# Patient Record
Sex: Male | Born: 1985 | Race: White | Hispanic: No | Marital: Married | Smoking: Never smoker
Health system: Southern US, Community
[De-identification: ages and names within clinical notes are randomized; demographics above are authoritative.]

## PROBLEM LIST (undated history)

## (undated) HISTORY — PX: COLONOSCOPY: SHX174

---
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
## 2007-01-31 ENCOUNTER — Emergency Department: Payer: Self-pay | Admitting: Emergency Medicine

## 2014-05-12 ENCOUNTER — Emergency Department: Payer: Self-pay

## 2014-05-12 LAB — COMPREHENSIVE METABOLIC PANEL
ALBUMIN: 3.9 g/dL (ref 3.4–5.0)
ALT: 20 U/L (ref 12–78)
AST: 17 U/L (ref 15–37)
Alkaline Phosphatase: 74 U/L
Anion Gap: 5 — ABNORMAL LOW (ref 7–16)
BUN: 12 mg/dL (ref 7–18)
Bilirubin,Total: 0.3 mg/dL (ref 0.2–1.0)
CALCIUM: 8.9 mg/dL (ref 8.5–10.1)
Chloride: 105 mmol/L (ref 98–107)
Co2: 29 mmol/L (ref 21–32)
Creatinine: 1 mg/dL (ref 0.60–1.30)
EGFR (African American): >60
EGFR (Non-African Amer.): >60
Glucose: 93 mg/dL (ref 65–99)
OSMOLALITY: 277 (ref 275–301)
Potassium: 4.3 mmol/L (ref 3.5–5.1)
Sodium: 139 mmol/L (ref 136–145)
Total Protein: 7.1 g/dL (ref 6.4–8.2)

## 2014-05-12 LAB — CBC WITH DIFFERENTIAL/PLATELET
Basophil #: 0 10*3/uL (ref 0.0–0.1)
Basophil %: 0.6 %
EOS ABS: 0.1 10*3/uL (ref 0.0–0.7)
Eosinophil %: 1.9 %
HCT: 46.7 % (ref 40.0–52.0)
HGB: 15.3 g/dL (ref 13.0–18.0)
LYMPHS PCT: 18.4 %
Lymphocyte #: 0.8 10*3/uL — ABNORMAL LOW (ref 1.0–3.6)
MCH: 31.8 pg (ref 26.0–34.0)
MCHC: 32.8 g/dL (ref 32.0–36.0)
MCV: 97 fL (ref 80–100)
MONOS PCT: 23.7 %
Monocyte #: 1.1 x10 3/mm — ABNORMAL HIGH (ref 0.2–1.0)
NEUTROS ABS: 2.5 10*3/uL (ref 1.4–6.5)
Neutrophil %: 55.4 %
Platelet: 125 10*3/uL — ABNORMAL LOW (ref 150–440)
RBC: 4.81 10*6/uL (ref 4.40–5.90)
RDW: 12.2 % (ref 11.5–14.5)
WBC: 4.6 10*3/uL (ref 3.8–10.6)

## 2014-05-12 LAB — URINALYSIS, COMPLETE
Bacteria: NONE SEEN
Bilirubin,UR: NEGATIVE
Glucose,UR: NEGATIVE mg/dL (ref 0–75)
Ketone: NEGATIVE
LEUKOCYTE ESTERASE: NEGATIVE
NITRITE: NEGATIVE
Ph: 5 (ref 4.5–8.0)
Protein: NEGATIVE
RBC,UR: 1 /HPF (ref 0–5)
Specific Gravity: 1.025 (ref 1.003–1.030)
Squamous Epithelial: NONE SEEN

## 2014-05-12 LAB — LIPASE, BLOOD: LIPASE: 293 U/L (ref 73–393)

## 2014-06-15 ENCOUNTER — Ambulatory Visit: Payer: Self-pay | Admitting: Gastroenterology

## 2015-12-16 ENCOUNTER — Emergency Department
Admission: EM | Admit: 2015-12-16 | Discharge: 2015-12-16 | Disposition: A | Discharge status: Home / Self Care | Payer: BLUE CROSS/BLUE SHIELD | Attending: Emergency Medicine | Admitting: Emergency Medicine

## 2015-12-16 DIAGNOSIS — L03115 Cellulitis of right lower limb: Secondary | ICD-10-CM | POA: Diagnosis not present

## 2015-12-16 DIAGNOSIS — L0390 Cellulitis, unspecified: Secondary | ICD-10-CM

## 2015-12-16 DIAGNOSIS — L02415 Cutaneous abscess of right lower limb: Secondary | ICD-10-CM | POA: Diagnosis present

## 2015-12-16 DIAGNOSIS — L0291 Cutaneous abscess, unspecified: Secondary | ICD-10-CM

## 2015-12-16 MED ORDER — CLINDAMYCIN HCL 300 MG PO CAPS: 300.0000 mg | ORAL_CAPSULE | Freq: Four times a day (QID) | ORAL | Status: DC

## 2015-12-16 MED ORDER — CLINDAMYCIN HCL 150 MG PO CAPS
300.0000 mg | ORAL_CAPSULE | Freq: Once | ORAL | Status: AC
  Administered 2015-12-16: 300 mg via ORAL
  Filled 2015-12-16: qty 2

## 2015-12-16 NOTE — ED Provider Notes (Signed)
[facility] Emergency Department Provider Note    ____________________________________________  Time seen: 2210  I have reviewed the triage vital signs and the nursing notes.   HISTORY  Chief Complaint Abscess   History limited by: Not Limited   HPI Jeremy Perry is a 30 y.o. male who presents to the emergency department today because of concerns for abscess. Patient states that he started developing swelling to his right thigh a number of days ago. It did become painful. He states that it started to drain spontaneously earlier today. He since that that has helped relieve his pain. He states that he has had abscesses in those areas before. He denies any fevers. Denies any nausea or vomiting.     No past medical history on file.  There are no active problems to display for this patient.   No past surgical history on file.  Current Outpatient Rx  Name  Route  Sig  Dispense  Refill  . clindamycin (CLEOCIN) 300 MG capsule   Oral   Take 1 capsule (300 mg total) by mouth 4 (four) times daily.   40 capsule   0     Allergies Ceclor  No family history on file.  Social History Works at [employer]  Constitutional: Negative for fever. Cardiovascular: Negative for chest pain. Respiratory: Negative for shortness of breath. Gastrointestinal: Negative for abdominal pain, vomiting and diarrhea. Skin: Redness, swelling to right thigh Neurological: Negative for headaches, focal weakness or numbness.   10-point ROS otherwise negative.  ____________________________________________   PHYSICAL EXAM:  VITAL SIGNS: ED Triage Vitals  Enc Vitals Group     BP 12/16/15 2138 143/65 mmHg     Pulse Rate 12/16/15 2138 71     Resp 12/16/15 2138 18     Temp 12/16/15 2138 97.7 F (36.5 C)     Temp Source 12/16/15 2138 Oral     SpO2 12/16/15 2138 99 %     Weight 12/16/15 2138 200 lb (90.719 kg)     Height 12/16/15 2138 6\' 1"  (1.854  m)     Head Cir --      Peak Flow --      Pain Score 12/16/15 2146 5   Constitutional: Alert and oriented. Well appearing and in no distress. Eyes: Conjunctivae are normal. PERRL. Normal extraocular movements. ENT   Head: Normocephalic and atraumatic.   Nose: No congestion/rhinnorhea.   Mouth/Throat: Mucous membranes are moist.   Neck: No stridor. Hematological/Lymphatic/Immunilogical: No cervical lymphadenopathy. Cardiovascular: Normal rate, regular rhythm.  No murmurs, rubs, or gallops. Respiratory: Normal respiratory effort without tachypnea nor retractions. Breath sounds are clear and equal bilaterally. No wheezes/rales/rhonchi. Gastrointestinal: Soft and nontender. No distention.  Genitourinary: Deferred Musculoskeletal: Normal range of motion in all extremities. No joint effusions.  No lower extremity tenderness nor edema. Neurologic:  Normal speech and language. No gross focal neurologic deficits are appreciated.  Skin:  Redness and warmth to right inner thigh. Area of small amount of drainage proximally consistent with ruptured abscess. No fluctuance. Bedside US did not show any fluid collection, some cobblestoning. Psychiatric: Mood and affect are normal. Speech and behavior are normal. Patient exhibits appropriate insight and judgment.  ____________________________________________    LABS (pertinent positives/negatives)  None  ____________________________________________   EKG  None  ____________________________________________    RADIOLOGY  None   ____________________________________________   PROCEDURES  Procedure(s) performed: None  Critical Care performed: No  ____________________________________________   INITIAL IMPRESSION / ASSESSMENT AND PLAN /  ED COURSE  Pertinent labs & imaging results that were available during my care of the patient were reviewed by me and considered in my medical decision making (see chart for  details).  Patient presented to the emergency department today with concerns for red Nissen and swelling to right inner thigh. Exam is consistent with a ruptured abscess. I did perform bedside ultrasound which did not show any remaining fluid however there was some cobblestoning. This point I do not feel like I&D would be of any added benefit. Place patient on antibiotics. Give surgery follow-up.  ____________________________________________   FINAL CLINICAL IMPRESSION(S) / ED DIAGNOSES  Final diagnoses:  Cellulitis and abscess     Nance Pear, MD 12/16/15 2321

## 2015-12-16 NOTE — Discharge Instructions (Signed)
Please seek medical attention for any high fevers, chest pain, shortness of breath, change in behavior, persistent vomiting, bloody stool or any other new or concerning symptoms. ° ° °Cellulitis °Cellulitis is an infection of the skin and the tissue beneath it. The infected area is usually red and tender. Cellulitis occurs most often in the arms and lower legs.  °CAUSES  °Cellulitis is caused by bacteria that enter the skin through cracks or cuts in the skin. The most common types of bacteria that cause cellulitis are staphylococci and streptococci. °SIGNS AND SYMPTOMS  °· Redness and warmth. °· Swelling. °· Tenderness or pain. °· Fever. °DIAGNOSIS  °Your health care provider can usually determine what is wrong based on a physical exam. Blood tests may also be done. °TREATMENT  °Treatment usually involves taking an antibiotic medicine. °HOME CARE INSTRUCTIONS  °· Take your antibiotic medicine as directed by your health care provider. Finish the antibiotic even if you start to feel better. °· Keep the infected arm or leg elevated to reduce swelling. °· Apply a warm cloth to the affected area up to 4 times per day to relieve pain. °· Take medicines only as directed by your health care provider. °· Keep all follow-up visits as directed by your health care provider. °SEEK MEDICAL CARE IF:  °· You notice red streaks coming from the infected area. °· Your red area gets larger or turns dark in color. °· Your bone or joint underneath the infected area becomes painful after the skin has healed. °· Your infection returns in the same area or another area. °· You notice a swollen bump in the infected area. °· You develop new symptoms. °· You have a fever. °SEEK IMMEDIATE MEDICAL CARE IF:  °· You feel very sleepy. °· You develop vomiting or diarrhea. °· You have a general ill feeling (malaise) with muscle aches and pains. °  °This information is not intended to replace advice given to you by your health care provider. Make sure  you discuss any questions you have with your health care provider. °  °Document Released: 09/03/2005 Document Revised: 08/15/2015 Document Reviewed: 02/09/2012 °Elsevier Interactive Patient Education ©2016 [employer] ° °

## 2015-12-16 NOTE — ED Notes (Signed)
Pt states that he has a reoccurring cyst to his inner thigh, pt states that he was placed on an antibiotic in nov, pt states that it came back and ruptured some but now the inside of his rt thigh is swollen, pt has an area approx the diameter of a soccer ball to the inner thigh

## 2016-08-14 ENCOUNTER — Ambulatory Visit (INDEPENDENT_AMBULATORY_CARE_PROVIDER_SITE_OTHER): Payer: BLUE CROSS/BLUE SHIELD | Admitting: Gastroenterology

## 2016-08-14 ENCOUNTER — Ambulatory Visit: Payer: Self-pay | Admitting: Gastroenterology

## 2016-08-14 VITALS — BP 136/71 | HR 78 | Temp 98.3°F | Ht 72.0 in | Wt 203.0 lb

## 2016-08-14 DIAGNOSIS — K921 Melena: Secondary | ICD-10-CM | POA: Diagnosis not present

## 2016-08-15 NOTE — Progress Notes (Signed)
   Primary Care Physician: Maryland Pink, MD  Primary Gastroenterologist:  Dr. Lucilla Lame  Chief Complaint  Patient presents with  . Diarrhea  . Rectal Pain    HPI: Jeremy Perry is a 30 y.o. male here Who has a history of rectal bleeding and had a colonoscopy last year.  The patient states that he had an episode of diarrhea with some rectal bleeding which quickly passed.  The patient and states that he had one episode since then of some bright red blood per rectum.  The patient states he has not had any further symptoms of diarrhea or abdominal pain.  The patient has been having normal bowel movements since the episode.  Current Outpatient Prescriptions  Medication Sig Dispense Refill  . clindamycin (CLEOCIN) 300 MG capsule Take 1 capsule (300 mg total) by mouth 4 (four) times daily. 40 capsule 0   No current facility-administered medications for this visit.     Allergies as of 08/14/2016 - Review Complete 08/14/2016  Allergen Reaction Noted  . Ceclor [cefaclor] Other (See Comments) 12/16/2015    ROS:  General: Negative for anorexia, weight loss, fever, chills, fatigue, weakness. ENT: Negative for hoarseness, difficulty swallowing , nasal congestion. CV: Negative for chest pain, angina, palpitations, dyspnea on exertion, peripheral edema.  Respiratory: Negative for dyspnea at rest, dyspnea on exertion, cough, sputum, wheezing.  GI: See history of present illness. GU:  Negative for dysuria, hematuria, urinary incontinence, urinary frequency, nocturnal urination.  Endo: Negative for unusual weight change.    Physical Examination:   BP 136/71   Pulse 78   Temp 98.3 F (36.8 C) (Oral)   Ht 6' (1.829 m)   Wt 203 lb (92.1 kg)   BMI 27.53 kg/m   General: Well-nourished, well-developed in no acute distress.  Eyes: No icterus. Conjunctivae pink. Mouth: Oropharyngeal mucosa moist and pink , no lesions erythema or exudate. Lungs: Clear to auscultation bilaterally.  Non-labored. Heart: Regular rate and rhythm, no murmurs rubs or gallops.  Abdomen: Bowel sounds are normal, nontender, nondistended, no hepatosplenomegaly or masses, no abdominal bruits or hernia , no rebound or guarding.   Extremities: No lower extremity edema. No clubbing or deformities. Neuro: Alert and oriented x 3.  Grossly intact. Skin: Warm and dry, no jaundice.   Psych: Alert and cooperative, normal mood and affect.  Labs:    Imaging Studies: No results found.  Assessment and Plan:   Jeremy Perry is a 30 y.o. y/o male who has a history of similar symptoms in the past with a colonoscopy not showing any cause for his symptoms.  The patient now reports bright red blood per rectum that has resolved. The patient has been told that a repeat colonoscopy is not needed at this time.  His symptoms have completely abated and was likely due to hemorrhoidal bleeding from a recent case of gastroenteritis.  The patient has been told to notify me if his symptoms recur.  The patient has been explained the plan and agrees with it.   Note: This dictation was prepared with Dragon dictation along with smaller phrase technology. Any transcriptional errors that result from this process are unintentional.

## 2018-12-29 ENCOUNTER — Other Ambulatory Visit: Payer: Self-pay | Admitting: Physician Assistant

## 2018-12-29 ENCOUNTER — Other Ambulatory Visit (HOSPITAL_COMMUNITY): Payer: Self-pay | Admitting: Physician Assistant

## 2018-12-29 DIAGNOSIS — Y93B9 Activity, other involving muscle strengthening exercises: Secondary | ICD-10-CM

## 2018-12-29 DIAGNOSIS — R1031 Right lower quadrant pain: Secondary | ICD-10-CM

## 2018-12-31 ENCOUNTER — Ambulatory Visit
Admission: RE | Admit: 2018-12-31 | Discharge: 2018-12-31 | Disposition: A | Discharge status: Home / Self Care | Payer: BLUE CROSS/BLUE SHIELD | Source: Ambulatory Visit | Attending: Physician Assistant | Admitting: Physician Assistant

## 2018-12-31 ENCOUNTER — Other Ambulatory Visit: Payer: Self-pay | Admitting: Physician Assistant

## 2018-12-31 DIAGNOSIS — Y93B9 Activity, other involving muscle strengthening exercises: Secondary | ICD-10-CM | POA: Diagnosis not present

## 2018-12-31 DIAGNOSIS — R1031 Right lower quadrant pain: Secondary | ICD-10-CM | POA: Diagnosis not present

## 2019-12-27 DIAGNOSIS — R1031 Right lower quadrant pain: Secondary | ICD-10-CM | POA: Diagnosis not present

## 2019-12-29 ENCOUNTER — Other Ambulatory Visit: Payer: Self-pay | Admitting: Student

## 2019-12-29 DIAGNOSIS — R1031 Right lower quadrant pain: Secondary | ICD-10-CM

## 2019-12-30 ENCOUNTER — Other Ambulatory Visit: Payer: Self-pay

## 2019-12-30 ENCOUNTER — Ambulatory Visit
Admission: RE | Admit: 2019-12-30 | Discharge: 2019-12-30 | Disposition: A | Discharge status: Home / Self Care | Payer: 59 | Source: Ambulatory Visit | Attending: Student | Admitting: Student

## 2019-12-30 DIAGNOSIS — R1030 Lower abdominal pain, unspecified: Secondary | ICD-10-CM | POA: Diagnosis not present

## 2019-12-30 DIAGNOSIS — R1031 Right lower quadrant pain: Secondary | ICD-10-CM | POA: Diagnosis not present

## 2019-12-30 MED ORDER — IOHEXOL 300 MG/ML  SOLN
100.0000 mL | Freq: Once | INTRAMUSCULAR | Status: AC | PRN
  Administered 2019-12-30: 100 mL via INTRAVENOUS

## 2020-01-09 ENCOUNTER — Ambulatory Visit: Payer: Self-pay | Admitting: General Surgery

## 2020-01-09 DIAGNOSIS — K4090 Unilateral inguinal hernia, without obstruction or gangrene, not specified as recurrent: Secondary | ICD-10-CM | POA: Diagnosis not present

## 2020-01-09 NOTE — H&P (View-Only) (Signed)
PATIENT PROFILE: Jeremy Perry is a 34 y.o. male who presents to the Clinic for consultation at the request of Eulas Post, [place] for evaluation of right groin pain.  PCP:  Lendon Colonel, PA  HISTORY OF PRESENT ILLNESS: Mr. Hemann reports having right groin pain since a year ago.  The patient reported the pain started in the groin. The pains radiates to the right testicle. He has been evaluated by 4 provides for the right groin pain.  One of the provided was a sports medicine doctor.  He evaluated the patient and was 1 who diagnosed with sports hernia.  He was recommended to rest and have anti-inflammatory medications.  The patient follows the recommendations but has not have any improvement of the pain.  The patient incisional male that is physically active that goes to gym.  He denies any pain amount of resting and ibuprofen during the beginning of the pandemic.  The patient reported that he is eating have any improvement of the right groin pain.  He had an ultrasound that did not show any pathology.  I personally evaluated the ultrasound.  Since he persisted with the right groin pain he had a CT scan recently that shows an increased fat layer on the right groin compared to the left groin.  I personally evaluated the CT scan.  There has been no alleviating or aggravating factor.   PROBLEM LIST:     Problem List  Date Reviewed: 12/27/2019   None      GENERAL REVIEW OF SYSTEMS:   General ROS: negative for - chills, fatigue, fever, weight gain or weight loss Allergy and Immunology ROS: negative for - hives  Hematological and Lymphatic ROS: negative for - bleeding problems or bruising, negative for palpable nodes Endocrine ROS: negative for - heat or cold intolerance, hair changes Respiratory ROS: negative for - cough, shortness of breath or wheezing Cardiovascular ROS: no chest pain or palpitations GI ROS: negative for nausea, vomiting, abdominal pain, diarrhea,  constipation Musculoskeletal ROS: negative for - joint swelling or muscle pain Neurological ROS: negative for - confusion, syncope Dermatological ROS: negative for pruritus and rash Psychiatric: negative for anxiety, depression, difficulty sleeping and memory loss  MEDICATIONS: Current Medications        Current Outpatient Medications  Medication Sig Dispense Refill  . ibuprofen (MOTRIN) 200 MG tablet Take 200 mg by mouth every 6 (six) hours as needed for Pain     No current facility-administered medications for this visit.       ALLERGIES: Ceclor [cefaclor]  PAST MEDICAL HISTORY: History reviewed. No pertinent past medical history.  PAST SURGICAL HISTORY: History reviewed. No pertinent surgical history.   FAMILY HISTORY:      Family History  Problem Relation Age of Onset  . Scoliosis Mother   . No Known Problems Father   . Stroke Paternal Grandmother   . Diabetes type II Paternal Grandmother   . Bipolar disorder Paternal Grandmother   . Myocardial Infarction (Heart attack) Paternal Grandmother   . No Known Problems Sister   . Cancer Maternal Grandmother   . Brain cancer Maternal Grandfather   . COPD Paternal Grandfather      SOCIAL HISTORY: Social History          Socioeconomic History  . Marital status: Married    Spouse name: Not on file  . Number of children: Not on file  . Years of education: Not on file  . Highest education level: Not on file  Occupational History  .  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 Financial resource strain: Not on file  . Food insecurity    Worry: Not on file    Inability: Not on file  . Transportation needs    Medical: Not on file    Non-medical: Not on file  Tobacco Use  . Smoking status: Never Smoker  . Smokeless tobacco: Never Used  Substance and Sexual Activity  . Alcohol use: Yes    Alcohol/week: 0.0 standard drinks    Frequency: 2-4 times a  month  . Drug use: Never  . Sexual activity: Yes    Partners: Female    Birth control/protection: None  Other Topics Concern  . Not on file  Social History Narrative  . Not on file      PHYSICAL EXAM:    Vitals:   01/09/20 0845  BP: 134/80  Pulse: 90   Body mass index is 27.48 kg/m. Weight: 89.4 kg (197 lb)   GENERAL: Alert, active, oriented x3  HEENT: Pupils equal reactive to light. Extraocular movements are intact. Sclera clear. Palpebral conjunctiva normal red color.Pharynx clear.  NECK: Supple with no palpable mass and no adenopathy.  LUNGS: Sound clear with no rales rhonchi or wheezes.  HEART: Regular rhythm S1 and S2 without murmur.  ABDOMEN: Soft and depressible, nontender with no palpable mass, no hepatomegaly.  No palpable hernia defect.  There was tenderness to palpation on the medial aspect of the right groin.  Examination was done using Valsalva.  EXTREMITIES: Well-developed well-nourished symmetrical with no dependent edema.  NEUROLOGICAL: Awake alert oriented, facial expression symmetrical, moving all extremities.  REVIEW OF DATA: I have reviewed the following data today:      Office Visit on 12/27/2019  Component Date Value  . WBC (White Blood Cell Co* 12/27/2019 4.3   . RBC (Red Blood Cell Coun* 12/27/2019 4.63*  . Hemoglobin 12/27/2019 14.9   . Hematocrit 12/27/2019 43.5   . MCV (Mean Corpuscular Vo* 12/27/2019 94.0   . MCH (Mean Corpuscular He* 12/27/2019 32.2*  . MCHC (Mean Corpuscular H* 12/27/2019 34.3   . Platelet Count 12/27/2019 159   . RDW-CV (Red Cell Distrib* 12/27/2019 11.2*  . MPV (Mean Platelet Volum* 12/27/2019 9.4   . Neutrophils 12/27/2019 2.50   . Lymphocytes 12/27/2019 1.20   . Mixed Count 12/27/2019 0.60   . Neutrophil % 12/27/2019 58.3   . Lymphocyte % 12/27/2019 28.6   . Mixed % 12/27/2019 13.1   . Glucose 12/27/2019 84   . Sodium 12/27/2019 140   . Potassium 12/27/2019 4.3   . Chloride 12/27/2019 106    . Carbon Dioxide (CO2) 12/27/2019 30.5   . Urea Nitrogen (BUN) 12/27/2019 15   . Creatinine 12/27/2019 1.0   . Glomerular Filtration Ra* 12/27/2019 87   . Calcium 12/27/2019 9.9   . AST  12/27/2019 20   . ALT  12/27/2019 28   . Alk Phos (alkaline Phosp* 12/27/2019 55   . Albumin 12/27/2019 4.7   . Bilirubin, Total 12/27/2019 0.7   . Protein, Total 12/27/2019 7.0   . A/G Ratio 12/27/2019 2.0      ASSESSMENT: Mr. Clouse is a 34 y.o. male presenting for consultation for right groin pain.  The patient presents with significant right groin pain with a year of onset.  The patient has tried resting and anti-inflammatory medication.  He has been doing some exercise to try to strengthen the lower extremity and core.  There has  been no change on the right groin pain since last year.  The pain is intermittent.  No alleviating or grading factor.  Due to the patient history of right groin pain of a year that has tried rest and anti-inflammatory medication and exercises without any improvement I agree with the sports medicine physician that sports hernia is high in the differential diagnosis.  I discussed with the patient about the diagnosis of sports hernia.  I oriented the patient about what the sports hernia mean basically a weakness of the pelvic floor and inguinal canal.  The patient also have a suspected lipoma on the CT scan that might be affecting and causing pain on the right groin as well. The patient was oriented about the treatment alternatives (observation vs surgical repair). Due to patient symptoms for a year or longer, repair with mesh is recommended. Patient oriented about the surgical procedure, the use of mesh and its risk of complications such as: infection, bleeding, injury to vas deference, vasculature and testicle, injury to bowel or bladder, and chronic pain.  Sports hernia, initial encounter [S39.81XA]  PLAN: 1. Robotic assisted laparoscopic right inguinal hernia  repair with mesh (71836) 2. CBC, CMP done 12/27/19 3. Avoid taking aspirin 5 days before the surgery 4. Contact us if has any question or concern.   Patient verbalized understanding, all questions were answered, and were agreeable with the plan outlined above.    Herbert Pun, MD  Electronically signed by Herbert Pun, MD

## 2020-01-09 NOTE — H&P (Signed)
PATIENT PROFILE: Jeremy Perry is a 34 y.o. male who presents to the Clinic for consultation at the request of Jeremy Perry, [place] for evaluation of right groin pain.  PCP:  Jeremy Colonel, PA  HISTORY OF PRESENT ILLNESS: Jeremy Perry reports having right groin pain since a year ago.  The patient reported the pain started in the groin. The pains radiates to the right testicle. He has been evaluated by 4 provides for the right groin pain.  One of the provided was a sports medicine doctor.  He evaluated the patient and was 1 who diagnosed with sports hernia.  He was recommended to rest and have anti-inflammatory medications.  The patient follows the recommendations but has not have any improvement of the pain.  The patient incisional male that is physically active that goes to gym.  He denies any pain amount of resting and ibuprofen during the beginning of the pandemic.  The patient reported that he is eating have any improvement of the right groin pain.  He had an ultrasound that did not show any pathology.  I personally evaluated the ultrasound.  Since he persisted with the right groin pain he had a CT scan recently that shows an increased fat layer on the right groin compared to the left groin.  I personally evaluated the CT scan.  There has been no alleviating or aggravating factor.   PROBLEM LIST:     Problem List  Date Reviewed: 12/27/2019   None      GENERAL REVIEW OF SYSTEMS:   General ROS: negative for - chills, fatigue, fever, weight gain or weight loss Allergy and Immunology ROS: negative for - hives  Hematological and Lymphatic ROS: negative for - bleeding problems or bruising, negative for palpable nodes Endocrine ROS: negative for - heat or cold intolerance, hair changes Respiratory ROS: negative for - cough, shortness of breath or wheezing Cardiovascular ROS: no chest pain or palpitations GI ROS: negative for nausea, vomiting, abdominal pain, diarrhea,  constipation Musculoskeletal ROS: negative for - joint swelling or muscle pain Neurological ROS: negative for - confusion, syncope Dermatological ROS: negative for pruritus and rash Psychiatric: negative for anxiety, depression, difficulty sleeping and memory loss  MEDICATIONS: Current Medications        Current Outpatient Medications  Medication Sig Dispense Refill  . ibuprofen (MOTRIN) 200 MG tablet Take 200 mg by mouth every 6 (six) hours as needed for Pain     No current facility-administered medications for this visit.       ALLERGIES: Ceclor [cefaclor]  PAST MEDICAL HISTORY: History reviewed. No pertinent past medical history.  PAST SURGICAL HISTORY: History reviewed. No pertinent surgical history.   FAMILY HISTORY:      Family History  Problem Relation Age of Onset  . Scoliosis Mother   . No Known Problems Father   . Stroke Paternal Grandmother   . Diabetes type II Paternal Grandmother   . Bipolar disorder Paternal Grandmother   . Myocardial Infarction (Heart attack) Paternal Grandmother   . No Known Problems Sister   . Cancer Maternal Grandmother   . Brain cancer Maternal Grandfather   . COPD Paternal Grandfather      SOCIAL HISTORY: Social History          Socioeconomic History  . Marital status: Married    Spouse name: Not on file  . Number of children: Not on file  . Years of education: Not on file  . Highest education level: Not on file  Occupational History  .  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 Financial resource strain: Not on file  . Food insecurity    Worry: Not on file    Inability: Not on file  . Transportation needs    Medical: Not on file    Non-medical: Not on file  Tobacco Use  . Smoking status: Never Smoker  . Smokeless tobacco: Never Used  Substance and Sexual Activity  . Alcohol use: Yes    Alcohol/week: 0.0 standard drinks    Frequency: 2-4 times a  month  . Drug use: Never  . Sexual activity: Yes    Partners: Female    Birth control/protection: None  Other Topics Concern  . Not on file  Social History Narrative  . Not on file      PHYSICAL EXAM:    Vitals:   01/09/20 0845  BP: 134/80  Pulse: 90   Body mass index is 27.48 kg/m. Weight: 89.4 kg (197 lb)   GENERAL: Alert, active, oriented x3  HEENT: Pupils equal reactive to light. Extraocular movements are intact. Sclera clear. Palpebral conjunctiva normal red color.Pharynx clear.  NECK: Supple with no palpable mass and no adenopathy.  LUNGS: Sound clear with no rales rhonchi or wheezes.  HEART: Regular rhythm S1 and S2 without murmur.  ABDOMEN: Soft and depressible, nontender with no palpable mass, no hepatomegaly.  No palpable hernia defect.  There was tenderness to palpation on the medial aspect of the right groin.  Examination was done using Valsalva.  EXTREMITIES: Well-developed well-nourished symmetrical with no dependent edema.  NEUROLOGICAL: Awake alert oriented, facial expression symmetrical, moving all extremities.  REVIEW OF DATA: I have reviewed the following data today:      Office Visit on 12/27/2019  Component Date Value  . WBC (White Blood Cell Co* 12/27/2019 4.3   . RBC (Red Blood Cell Coun* 12/27/2019 4.63*  . Hemoglobin 12/27/2019 14.9   . Hematocrit 12/27/2019 43.5   . MCV (Mean Corpuscular Vo* 12/27/2019 94.0   . MCH (Mean Corpuscular He* 12/27/2019 32.2*  . MCHC (Mean Corpuscular H* 12/27/2019 34.3   . Platelet Count 12/27/2019 159   . RDW-CV (Red Cell Distrib* 12/27/2019 11.2*  . MPV (Mean Platelet Volum* 12/27/2019 9.4   . Neutrophils 12/27/2019 2.50   . Lymphocytes 12/27/2019 1.20   . Mixed Count 12/27/2019 0.60   . Neutrophil % 12/27/2019 58.3   . Lymphocyte % 12/27/2019 28.6   . Mixed % 12/27/2019 13.1   . Glucose 12/27/2019 84   . Sodium 12/27/2019 140   . Potassium 12/27/2019 4.3   . Chloride 12/27/2019 106    . Carbon Dioxide (CO2) 12/27/2019 30.5   . Urea Nitrogen (BUN) 12/27/2019 15   . Creatinine 12/27/2019 1.0   . Glomerular Filtration Ra* 12/27/2019 87   . Calcium 12/27/2019 9.9   . AST  12/27/2019 20   . ALT  12/27/2019 28   . Alk Phos (alkaline Phosp* 12/27/2019 55   . Albumin 12/27/2019 4.7   . Bilirubin, Total 12/27/2019 0.7   . Protein, Total 12/27/2019 7.0   . A/G Ratio 12/27/2019 2.0      ASSESSMENT: Mr. Chestnut is a 34 y.o. male presenting for consultation for right groin pain.  The patient presents with significant right groin pain with a year of onset.  The patient has tried resting and anti-inflammatory medication.  He has been doing some exercise to try to strengthen the lower extremity and core.  There has  been no change on the right groin pain since last year.  The pain is intermittent.  No alleviating or grading factor.  Due to the patient history of right groin pain of a year that has tried rest and anti-inflammatory medication and exercises without any improvement I agree with the sports medicine physician that sports hernia is high in the differential diagnosis.  I discussed with the patient about the diagnosis of sports hernia.  I oriented the patient about what the sports hernia mean basically a weakness of the pelvic floor and inguinal canal.  The patient also have a suspected lipoma on the CT scan that might be affecting and causing pain on the right groin as well. The patient was oriented about the treatment alternatives (observation vs surgical repair). Due to patient symptoms for a year or longer, repair with mesh is recommended. Patient oriented about the surgical procedure, the use of mesh and its risk of complications such as: infection, bleeding, injury to vas deference, vasculature and testicle, injury to bowel or bladder, and chronic pain.  Sports hernia, initial encounter [S39.81XA]  PLAN: 1. Robotic assisted laparoscopic right inguinal hernia  repair with mesh ([id number]) 2. CBC, CMP done 12/27/19 3. Avoid taking aspirin 5 days before the surgery 4. Contact us if has any question or concern.   Patient verbalized understanding, all questions were answered, and were agreeable with the plan outlined above.    Herbert Pun, MD  Electronically signed by Herbert Pun, MD

## 2020-01-17 ENCOUNTER — Other Ambulatory Visit: Payer: Self-pay

## 2020-01-17 ENCOUNTER — Encounter
Admission: RE | Admit: 2020-01-17 | Discharge: 2020-01-17 | Disposition: A | Discharge status: Home / Self Care | Payer: Managed Care, Other (non HMO) | Source: Ambulatory Visit | Attending: General Surgery | Admitting: General Surgery

## 2020-01-17 NOTE — Patient Instructions (Signed)
Your procedure is scheduled on: 01/25/20 Report to [facility]. To find out your arrival time please call [phone] between 1PM - 3PM on 01/24/20.  Remember: Instructions that are not followed completely may result in serious medical risk, up to and including death, or upon the discretion of your surgeon and anesthesiologist your surgery may need to be rescheduled.     _X__ 1. Do not eat food after midnight the night before your procedure.                 No gum chewing or hard candies. You may drink clear liquids up to 2 hours                 before you are scheduled to arrive for your surgery- DO not drink clear                 liquids within 2 hours of the start of your surgery.                 Clear Liquids include:  water, apple juice without pulp, clear carbohydrate                 drink such as Clearfast or Gatorade, Black Coffee or Tea (Do not add                 anything to coffee or tea). Diabetics water only  __X__2.  On the morning of surgery brush your teeth with toothpaste and water, you                 may rinse your mouth with mouthwash if you wish.  Do not swallow any              toothpaste of mouthwash.     _X__ 3.  No Alcohol for 24 hours before or after surgery.   _X__ 4.  Do Not Smoke or use e-cigarettes For 24 Hours Prior to Your Surgery.                 Do not use any chewable tobacco products for at least 6 hours prior to                 surgery.  ____  5.  Bring all medications with you on the day of surgery if instructed.   __X__  6.  Notify your doctor if there is any change in your medical condition      (cold, fever, infections).     Do not wear jewelry, make-up, hairpins, clips or nail polish. Do not wear lotions, powders, or perfumes.  Do not shave 48 hours prior to surgery. Men may shave face and neck. Do not bring valuables to the hospital.    [facility] is not responsible for any belongings or  valuables.  Contacts, dentures/partials or body piercings may not be worn into surgery. Bring a case for your contacts, glasses or hearing aids, a denture cup will be supplied. Leave your suitcase in the car. After surgery it may be brought to your room. For patients admitted to the hospital, discharge time is determined by your treatment team.   Patients discharged the day of surgery will not be allowed to drive home.   Please read over the following fact sheets that you were given:   MRSA Information  __X__ Take these medicines the morning of surgery with A SIP OF WATER:  1. none  2.   3.   4.  5.  6.  ____ Fleet Enema (as directed)   __X__ Use CHG Soap/SAGE wipes as directed  ____ Use inhalers on the day of surgery  ____ Stop metformin/Janumet/Farxiga 2 days prior to surgery    ____ Take 1/2 of usual insulin dose the night before surgery. No insulin the morning          of surgery.   ____ Stop Blood Thinners Coumadin/Plavix/Xarelto/Pleta/Pradaxa/Eliquis/Effient/Aspirin  on   Or contact your Surgeon, Cardiologist or Medical Doctor regarding  ability to stop your blood thinners  __X__ Stop Anti-inflammatories 7 days before surgery such as Advil, Ibuprofen, Motrin,  BC or Goodies Powder, Naprosyn, Naproxen, Aleve, Aspirin    __X__ Stop all herbal supplements, fish oil or vitamin E until after surgery.    ____ Bring C-Pap to the hospital.

## 2020-01-23 ENCOUNTER — Other Ambulatory Visit
Admission: RE | Admit: 2020-01-23 | Discharge: 2020-01-23 | Disposition: A | Discharge status: Home / Self Care | Payer: Managed Care, Other (non HMO) | Source: Ambulatory Visit | Attending: General Surgery | Admitting: General Surgery

## 2020-01-23 ENCOUNTER — Other Ambulatory Visit: Payer: Self-pay

## 2020-01-23 DIAGNOSIS — Z20822 Contact with and (suspected) exposure to covid-19: Secondary | ICD-10-CM | POA: Insufficient documentation

## 2020-01-23 DIAGNOSIS — Z01812 Encounter for preprocedural laboratory examination: Secondary | ICD-10-CM | POA: Insufficient documentation

## 2020-01-24 LAB — SARS CORONAVIRUS 2 (TAT 6-24 HRS): SARS Coronavirus 2: NEGATIVE

## 2020-01-24 MED ORDER — CLINDAMYCIN PHOSPHATE 900 MG/50ML IV SOLN
900.0000 mg | INTRAVENOUS | Status: AC
  Administered 2020-01-25: 13:00:00 900 mg via INTRAVENOUS

## 2020-01-25 ENCOUNTER — Other Ambulatory Visit: Payer: Self-pay

## 2020-01-25 ENCOUNTER — Ambulatory Visit: Payer: 59 | Admitting: Certified Registered Nurse Anesthetist

## 2020-01-25 ENCOUNTER — Ambulatory Visit
Admission: RE | Admit: 2020-01-25 | Discharge: 2020-01-25 | Disposition: A | Discharge status: Home / Self Care | Payer: 59 | Attending: General Surgery | Admitting: General Surgery

## 2020-01-25 ENCOUNTER — Encounter: Payer: Self-pay | Admitting: General Surgery

## 2020-01-25 ENCOUNTER — Encounter
Admission: RE | Disposition: A | Discharge status: Home / Self Care | Payer: Self-pay | Source: Home / Self Care | Attending: General Surgery

## 2020-01-25 DIAGNOSIS — Z823 Family history of stroke: Secondary | ICD-10-CM | POA: Diagnosis not present

## 2020-01-25 DIAGNOSIS — K4090 Unilateral inguinal hernia, without obstruction or gangrene, not specified as recurrent: Secondary | ICD-10-CM | POA: Diagnosis not present

## 2020-01-25 DIAGNOSIS — Z8249 Family history of ischemic heart disease and other diseases of the circulatory system: Secondary | ICD-10-CM | POA: Insufficient documentation

## 2020-01-25 DIAGNOSIS — Z791 Long term (current) use of non-steroidal anti-inflammatories (NSAID): Secondary | ICD-10-CM | POA: Diagnosis not present

## 2020-01-25 DIAGNOSIS — Z888 Allergy status to other drugs, medicaments and biological substances status: Secondary | ICD-10-CM | POA: Insufficient documentation

## 2020-01-25 DIAGNOSIS — Z818 Family history of other mental and behavioral disorders: Secondary | ICD-10-CM | POA: Insufficient documentation

## 2020-01-25 DIAGNOSIS — Z825 Family history of asthma and other chronic lower respiratory diseases: Secondary | ICD-10-CM | POA: Diagnosis not present

## 2020-01-25 DIAGNOSIS — Z8269 Family history of other diseases of the musculoskeletal system and connective tissue: Secondary | ICD-10-CM | POA: Diagnosis not present

## 2020-01-25 DIAGNOSIS — R69 Illness, unspecified: Secondary | ICD-10-CM | POA: Diagnosis not present

## 2020-01-25 DIAGNOSIS — D176 Benign lipomatous neoplasm of spermatic cord: Secondary | ICD-10-CM | POA: Diagnosis not present

## 2020-01-25 DIAGNOSIS — Z808 Family history of malignant neoplasm of other organs or systems: Secondary | ICD-10-CM | POA: Insufficient documentation

## 2020-01-25 DIAGNOSIS — Z833 Family history of diabetes mellitus: Secondary | ICD-10-CM | POA: Diagnosis not present

## 2020-01-25 DIAGNOSIS — Z881 Allergy status to other antibiotic agents status: Secondary | ICD-10-CM | POA: Insufficient documentation

## 2020-01-25 DIAGNOSIS — Z809 Family history of malignant neoplasm, unspecified: Secondary | ICD-10-CM | POA: Diagnosis not present

## 2020-01-25 HISTORY — PX: XI ROBOTIC ASSISTED INGUINAL HERNIA REPAIR WITH MESH: SHX6706

## 2020-01-25 SURGERY — REPAIR, HERNIA, INGUINAL, ROBOT-ASSISTED, LAPAROSCOPIC, USING MESH
Anesthesia: General | Site: Groin | Laterality: Right

## 2020-01-25 MED ORDER — HYDROCODONE-ACETAMINOPHEN 5-325 MG PO TABS: 1.0000 | ORAL_TABLET | ORAL | 0 refills | Status: AC | PRN

## 2020-01-25 MED ORDER — BUPIVACAINE-EPINEPHRINE 0.25% -1:200000 IJ SOLN
INTRAMUSCULAR | Status: DC | PRN
  Administered 2020-01-25: 10 mL

## 2020-01-25 MED ORDER — MIDAZOLAM HCL 2 MG/2ML IJ SOLN
INTRAMUSCULAR | Status: AC
  Filled 2020-01-25: qty 2

## 2020-01-25 MED ORDER — DEXAMETHASONE SODIUM PHOSPHATE 10 MG/ML IJ SOLN
INTRAMUSCULAR | Status: DC | PRN
  Administered 2020-01-25: 10 mg via INTRAVENOUS

## 2020-01-25 MED ORDER — SUGAMMADEX SODIUM 200 MG/2ML IV SOLN
INTRAVENOUS | Status: DC | PRN
  Administered 2020-01-25: 200 mg via INTRAVENOUS

## 2020-01-25 MED ORDER — EPINEPHRINE PF 1 MG/ML IJ SOLN
INTRAMUSCULAR | Status: AC
  Filled 2020-01-25: qty 1

## 2020-01-25 MED ORDER — ROCURONIUM BROMIDE 50 MG/5ML IV SOLN
INTRAVENOUS | Status: AC
  Filled 2020-01-25: qty 1

## 2020-01-25 MED ORDER — LIDOCAINE HCL (PF) 2 % IJ SOLN
INTRAMUSCULAR | Status: AC
  Filled 2020-01-25: qty 10

## 2020-01-25 MED ORDER — PROMETHAZINE HCL 25 MG/ML IJ SOLN: 6.2500 mg | INTRAMUSCULAR | Status: DC | PRN

## 2020-01-25 MED ORDER — ROCURONIUM BROMIDE 100 MG/10ML IV SOLN
INTRAVENOUS | Status: DC | PRN
  Administered 2020-01-25: 10 mg via INTRAVENOUS
  Administered 2020-01-25: 50 mg via INTRAVENOUS
  Administered 2020-01-25: 20 mg via INTRAVENOUS

## 2020-01-25 MED ORDER — ONDANSETRON HCL 4 MG/2ML IJ SOLN
INTRAMUSCULAR | Status: DC | PRN
  Administered 2020-01-25: 4 mg via INTRAVENOUS

## 2020-01-25 MED ORDER — CEFAZOLIN SODIUM-DEXTROSE 2-4 GM/100ML-% IV SOLN
INTRAVENOUS | Status: AC
  Filled 2020-01-25: qty 100

## 2020-01-25 MED ORDER — DEXAMETHASONE SODIUM PHOSPHATE 10 MG/ML IJ SOLN
INTRAMUSCULAR | Status: AC
  Filled 2020-01-25: qty 1

## 2020-01-25 MED ORDER — HYDROCODONE-ACETAMINOPHEN 5-325 MG PO TABS
ORAL_TABLET | ORAL | Status: AC
  Filled 2020-01-25: qty 1

## 2020-01-25 MED ORDER — CLINDAMYCIN PHOSPHATE 900 MG/50ML IV SOLN
INTRAVENOUS | Status: AC
  Filled 2020-01-25: qty 50

## 2020-01-25 MED ORDER — HYDROCODONE-ACETAMINOPHEN 5-325 MG PO TABS
1.0000 | ORAL_TABLET | ORAL | Status: DC | PRN
  Administered 2020-01-25: 1 via ORAL

## 2020-01-25 MED ORDER — FENTANYL CITRATE (PF) 100 MCG/2ML IJ SOLN
INTRAMUSCULAR | Status: DC | PRN
  Administered 2020-01-25: 100 ug via INTRAVENOUS

## 2020-01-25 MED ORDER — ACETAMINOPHEN 10 MG/ML IV SOLN
INTRAVENOUS | Status: DC | PRN
  Administered 2020-01-25: 1000 mg via INTRAVENOUS

## 2020-01-25 MED ORDER — ACETAMINOPHEN 10 MG/ML IV SOLN
INTRAVENOUS | Status: AC
  Filled 2020-01-25: qty 100

## 2020-01-25 MED ORDER — FENTANYL CITRATE (PF) 100 MCG/2ML IJ SOLN
25.0000 ug | INTRAMUSCULAR | Status: DC | PRN
  Administered 2020-01-25 (×3): 25 ug via INTRAVENOUS

## 2020-01-25 MED ORDER — FAMOTIDINE 20 MG PO TABS
ORAL_TABLET | ORAL | Status: AC
  Filled 2020-01-25: qty 1

## 2020-01-25 MED ORDER — FENTANYL CITRATE (PF) 100 MCG/2ML IJ SOLN
INTRAMUSCULAR | Status: AC
  Filled 2020-01-25: qty 2

## 2020-01-25 MED ORDER — FAMOTIDINE 20 MG PO TABS
20.0000 mg | ORAL_TABLET | Freq: Once | ORAL | Status: AC
  Administered 2020-01-25: 20 mg via ORAL

## 2020-01-25 MED ORDER — LIDOCAINE HCL (CARDIAC) PF 100 MG/5ML IV SOSY
PREFILLED_SYRINGE | INTRAVENOUS | Status: DC | PRN
  Administered 2020-01-25: 100 mg via INTRAVENOUS

## 2020-01-25 MED ORDER — MIDAZOLAM HCL 2 MG/2ML IJ SOLN
INTRAMUSCULAR | Status: DC | PRN
  Administered 2020-01-25: 2 mg via INTRAVENOUS

## 2020-01-25 MED ORDER — ONDANSETRON HCL 4 MG/2ML IJ SOLN
INTRAMUSCULAR | Status: AC
  Filled 2020-01-25: qty 2

## 2020-01-25 MED ORDER — PHENYLEPHRINE HCL (PRESSORS) 10 MG/ML IV SOLN
INTRAVENOUS | Status: DC | PRN
  Administered 2020-01-25 (×2): 100 ug via INTRAVENOUS

## 2020-01-25 MED ORDER — PROPOFOL 10 MG/ML IV BOLUS
INTRAVENOUS | Status: AC
  Filled 2020-01-25: qty 20

## 2020-01-25 MED ORDER — LACTATED RINGERS IV SOLN: INTRAVENOUS | Status: DC

## 2020-01-25 MED ORDER — KETOROLAC TROMETHAMINE 30 MG/ML IJ SOLN
INTRAMUSCULAR | Status: DC | PRN
  Administered 2020-01-25: 30 mg via INTRAVENOUS

## 2020-01-25 MED ORDER — FENTANYL CITRATE (PF) 100 MCG/2ML IJ SOLN
INTRAMUSCULAR | Status: AC
  Administered 2020-01-25: 25 ug via INTRAVENOUS
  Filled 2020-01-25: qty 2

## 2020-01-25 MED ORDER — PROPOFOL 10 MG/ML IV BOLUS
INTRAVENOUS | Status: DC | PRN
  Administered 2020-01-25: 200 mg via INTRAVENOUS

## 2020-01-25 MED ORDER — BUPIVACAINE HCL (PF) 0.25 % IJ SOLN
INTRAMUSCULAR | Status: AC
  Filled 2020-01-25: qty 30

## 2020-01-25 SURGICAL SUPPLY — 50 items
BAG INFUSER PRESSURE 100CC (MISCELLANEOUS) IMPLANT
BLADE SURG SZ11 CARB STEEL (BLADE) ×3 IMPLANT
CANISTER SUCT 1200ML W/VALVE (MISCELLANEOUS) ×3 IMPLANT
CHLORAPREP W/TINT 26 (MISCELLANEOUS) ×3 IMPLANT
COVER TIP SHEARS 8 DVNC (MISCELLANEOUS) ×1 IMPLANT
COVER TIP SHEARS 8MM DA VINCI (MISCELLANEOUS) ×2
COVER WAND RF STERILE (DRAPES) ×6 IMPLANT
DEFOGGER SCOPE WARMER CLEARIFY (MISCELLANEOUS) ×3 IMPLANT
DERMABOND ADVANCED (GAUZE/BANDAGES/DRESSINGS) ×2
DERMABOND ADVANCED .7 DNX12 (GAUZE/BANDAGES/DRESSINGS) ×1 IMPLANT
DRAPE 3/4 80X56 (DRAPES) ×3 IMPLANT
DRAPE ARM DVNC X/XI (DISPOSABLE) ×3 IMPLANT
DRAPE COLUMN DVNC XI (DISPOSABLE) ×1 IMPLANT
DRAPE DA VINCI XI ARM (DISPOSABLE) ×6
DRAPE DA VINCI XI COLUMN (DISPOSABLE) ×2
ELECT CAUTERY BLADE 6.4 (BLADE) IMPLANT
ELECT REM PT RETURN 9FT ADLT (ELECTROSURGICAL) ×3
ELECTRODE REM PT RTRN 9FT ADLT (ELECTROSURGICAL) ×1 IMPLANT
GLOVE BIO SURGEON STRL SZ 6.5 (GLOVE) ×4 IMPLANT
GLOVE BIO SURGEONS STRL SZ 6.5 (GLOVE) ×2
GLOVE BIOGEL PI IND STRL 6.5 (GLOVE) ×2 IMPLANT
GLOVE BIOGEL PI INDICATOR 6.5 (GLOVE) ×4
GOWN STRL REUS W/ TWL LRG LVL3 (GOWN DISPOSABLE) ×3 IMPLANT
GOWN STRL REUS W/TWL LRG LVL3 (GOWN DISPOSABLE) ×6
IRRIGATOR SUCT 8 DISP DVNC XI (IRRIGATION / IRRIGATOR) IMPLANT
IRRIGATOR SUCTION 8MM XI DISP (IRRIGATION / IRRIGATOR)
IV NS 1000ML (IV SOLUTION)
IV NS 1000ML BAXH (IV SOLUTION) IMPLANT
KIT PINK PAD W/HEAD ARE REST (MISCELLANEOUS) ×3
KIT PINK PAD W/HEAD ARM REST (MISCELLANEOUS) ×1 IMPLANT
LABEL OR SOLS (LABEL) IMPLANT
MESH 3DMAX 4X6 RT LRG (Mesh General) ×2 IMPLANT
MESH 3DMAX MID 4X6 RT LRG (Mesh General) ×1 IMPLANT
NEEDLE HYPO 22GX1.5 SAFETY (NEEDLE) ×3 IMPLANT
NEEDLE INSUFFLATION 14GA 120MM (NEEDLE) ×3 IMPLANT
OBTURATOR OPTICAL STANDARD 8MM (TROCAR) ×2
OBTURATOR OPTICAL STND 8 DVNC (TROCAR) ×1
OBTURATOR OPTICALSTD 8 DVNC (TROCAR) ×1 IMPLANT
PACK LAP CHOLECYSTECTOMY (MISCELLANEOUS) ×3 IMPLANT
PENCIL ELECTRO HAND CTR (MISCELLANEOUS) IMPLANT
SEAL CANN UNIV 5-8 DVNC XI (MISCELLANEOUS) ×3 IMPLANT
SEAL XI 5MM-8MM UNIVERSAL (MISCELLANEOUS) ×6
SOLUTION ELECTROLUBE (MISCELLANEOUS) ×3 IMPLANT
SUT MNCRL AB 4-0 PS2 18 (SUTURE) ×3 IMPLANT
SUT VIC AB 2-0 SH 27 (SUTURE) ×2
SUT VIC AB 2-0 SH 27XBRD (SUTURE) ×1 IMPLANT
SUT VLOC 90 S/L VL9 GS22 (SUTURE) ×6 IMPLANT
TAPE TRANSPORE STRL 2 31045 (GAUZE/BANDAGES/DRESSINGS) ×3 IMPLANT
TRAY FOLEY MTR SLVR 16FR STAT (SET/KITS/TRAYS/PACK) ×3 IMPLANT
TUBING EVAC SMOKE HEATED PNEUM (TUBING) ×3 IMPLANT

## 2020-01-25 NOTE — Interval H&P Note (Signed)
History and Physical Interval Note:  01/25/2020 12:51 PM  Jeremy Perry  has presented today for surgery, with the diagnosis of S39.81XA - sports hernia (Right groin).  The various methods of treatment have been discussed with the patient and family. After consideration of risks, benefits and other options for treatment, the patient has consented to  Procedure(s): XI ROBOTIC [place] (Right) as a surgical intervention.  The patient's history has been reviewed, patient examined, no change in status, stable for surgery.  I have reviewed the patient's chart and labs.  Right groin marked in the pre procedure area. Questions were answered to the patient's satisfaction.     Herbert Pun

## 2020-01-25 NOTE — Anesthesia Preprocedure Evaluation (Signed)
Anesthesia Evaluation  Patient identified by MRN, date of birth, ID band Patient awake    Reviewed: Allergy & Precautions, H&P , NPO status , Patient's Chart, lab work & pertinent test results, reviewed documented beta blocker date and time   History of Anesthesia Complications Negative for: history of anesthetic complications  Airway Mallampati: I  TM Distance: >3 FB Neck ROM: full    Dental  (+) Dental Advidsory Given, Teeth Intact   Pulmonary neg pulmonary ROS,    Pulmonary exam normal        Cardiovascular Exercise Tolerance: Good negative cardio ROS Normal cardiovascular exam     Neuro/Psych negative neurological ROS  negative psych ROS   GI/Hepatic negative GI ROS, Neg liver ROS,   Endo/Other  negative endocrine ROS  Renal/GU negative Renal ROS  negative genitourinary   Musculoskeletal   Abdominal   Peds  Hematology negative hematology ROS (+)   Anesthesia Other Findings History reviewed. No pertinent past medical history.   Reproductive/Obstetrics negative OB ROS                             Anesthesia Physical Anesthesia Plan  ASA: I  Anesthesia Plan: General   Post-op Pain Management:    Induction: Intravenous  PONV Risk Score and Plan: 2 and Ondansetron, Dexamethasone, Midazolam and Treatment may vary due to age or medical condition  Airway Management Planned: Oral ETT  Additional Equipment:   Intra-op Plan:   Post-operative Plan: Extubation in OR  Informed Consent: I have reviewed the patients History and Physical, chart, labs and discussed the procedure including the risks, benefits and alternatives for the proposed anesthesia with the patient or authorized representative who has indicated his/her understanding and acceptance.     Dental Advisory Given  Plan Discussed with: Anesthesiologist, CRNA and Surgeon  Anesthesia Plan Comments:          Anesthesia Quick Evaluation

## 2020-01-25 NOTE — Discharge Instructions (Addendum)
  Diet: Resume home heart healthy regular diet.   Activity: No heavy lifting >20 pounds (children, pets, laundry, garbage) or strenuous activity until follow-up, but light activity and walking are encouraged. Do not drive or drink alcohol if taking narcotic pain medications.  Wound care: May shower with soapy water and pat dry (do not rub incisions), but no baths or submerging incision underwater until follow-up. (no swimming)   Medications: Resume all home medications. For mild to moderate pain: acetaminophen (Tylenol) or ibuprofen (if no kidney disease). Combining Tylenol with alcohol can substantially increase your risk of causing liver disease. Narcotic pain medications, if prescribed, can be used for severe pain, though may cause nausea, constipation, and drowsiness. Do not combine Tylenol and Norco within a 6 hour period as Norco contains Tylenol. If you do not need the narcotic pain medication, you do not need to fill the prescription.  Call office ([phone]) at any time if any questions, worsening pain, fevers/chills, bleeding, drainage from incision site, or other concerns.   AMBULATORY SURGERY  DISCHARGE INSTRUCTIONS   1) The drugs that you were given will stay in your system until tomorrow so for the next 24 hours you should not:  A) Drive an automobile B) Make any legal decisions C) Drink any alcoholic beverage   2) You may resume regular meals tomorrow.  Today it is better to start with liquids and gradually work up to solid foods.  You may eat anything you prefer, but it is better to start with liquids, then soup and crackers, and gradually work up to solid foods.   3) Please notify your doctor immediately if you have any unusual bleeding, trouble breathing, redness and pain at the surgery site, drainage, fever, or pain not relieved by medication.    4) Additional Instructions:        Please contact your physician with any problems or Same Day Surgery at  [phone], Monday through Friday 6 am to 4 pm, or [place] [facility] Main number at [phone]. 

## 2020-01-25 NOTE — Transfer of Care (Signed)
Immediate Anesthesia Transfer of Care Note  Patient: Jeremy Perry  Procedure(s) Performed: XI ROBOTIC ASSISTED INGUINAL HERNIA REPAIR WITH MESH (Right Groin)  Patient Location: PACU  Anesthesia Type:General  Level of Consciousness: awake, alert  and oriented  Airway & Oxygen Therapy: Patient Spontanous Breathing and Patient connected to face mask oxygen  Post-op Assessment: Report given to RN and Post -op Vital signs reviewed and stable  Post vital signs: Reviewed and stable  Last Vitals:  Vitals Value Taken Time  BP 137/79 01/25/20 1459  Temp 36.2 C 01/25/20 1459  Pulse 78 01/25/20 1459  Resp 20 01/25/20 1459  SpO2 100 % 01/25/20 1459  Vitals shown include unvalidated device data.  Last Pain:  Vitals:   01/25/20 1459  TempSrc: Temporal  PainSc:       Patients Stated Pain Goal: 0 ([id number])  Complications: No apparent anesthesia complications

## 2020-01-25 NOTE — Op Note (Signed)
Preoperative diagnosis: Right sports inguinal hernia.   Postoperative diagnosis: Right indirect inguinal hernia.  Procedure: Robotic assisted Laparoscopic Transabdominal preperitoneal laparoscopic (TAPP) repair of right inguinal hernia.  Anesthesia: GETA  Surgeon: Dr. Windell Moment  Wound Classification: Clean  Indications:  Patient is a 34 y.o. male developed a symptomatic right groin pain. Patient tried rest and NSAID without improvement of pain. Sports hernia diagnosed. Repair was indicated.  Findings: 1. Right indirect Inguinal hernia identified with large cord lipoma 2. Vas deferens and cord structures identified and preserved 3. Bard 3D Max mesh used for repair 4. Adequate hemostasis.   Description of procedure: The patient was taken to the operating room and the correct side of surgery was verified. The patient was placed supine with arms tucked at the sides. After obtaining adequate anesthesia, the patient's abdomen was prepped and draped in standard sterile fashion. The patient was placed in the Trendelenburg position. A time-out was completed verifying correct patient, procedure, site, positioning, and implant(s) and/or special equipment prior to beginning this procedure. A Veress needle was placed at the umbilicus and pneumoperitoneum created with insufflation of carbon dioxide to 15 mmHg. After the Veress needle was removed, an 8-mm trocar was placed on epigastric area and the 30 angled laparoscope inserted. Two 8-mm trocars were then placed lateral to the rectus sheath under direct visualization. Both inguinal regions were inspected and the median umbilical ligament, medial umbilical ligament, and lateral umbilical fold were identified.  The robotic arms were docked. The robotic scope was inserted and the pelvic area anatomy targeted.  The peritoneum was incised with scissors along a line 5 cm above the superior edge of the hernia defect, extending from the median umbilical ligament  to the anterior superior iliac spine. The peritoneal flap was mobilized inferiorly using blunt and sharp dissection. The inferior epigastric vessels were exposed and the pubic symphysis was identified. Cooper's ligament was dissected to its junction with the iliac vein. The dissection was continued inferiorly to the iliopubic tract, with care taken to avoid injury to the femoral branch of the genitofemoral nerve and the lateral femoral cutaneous nerve. The cord structures were parietalized. The hernia was identified and reduced by gentle traction.  The indirect hernia sac and a large cord lipoma were noted mobilized from the cord structures and reduced into the peritoneal cavity.  A large piece of mesh was rolled longitudinally into a compact cylinder and passed through a trocar. The cylinder was placed along the inferior aspect of the working space and unrolled into place to completely cover the direct, indirect, and femoral spaces. The mesh was secured into place superiorly to the anterior abdominal wall and inferiorly and medially to Cooper's ligament with absorbable sutures. Care was taken to avoid the inferolateral triangles containing the iliac vessels and genital nerves. The peritoneal flap was closed over the mesh and secured with suture in similar positions of safety. After ensuring adequate hemostasis, the trocars were removed and the pneumoperitoneum allowed to escape. The trocar incisions were closed using monocryl and skin adhesive dressings applied.  The patient tolerated the procedure well and was taken to the postanesthesia care unit in stable condition.   Specimen: none   Complications: None  Estimated Blood Loss: 5 mL

## 2020-01-25 NOTE — Anesthesia Procedure Notes (Signed)
Procedure Name: Intubation Date/Time: 01/25/2020 1:15 PM Performed by: Johnna Acosta, CRNA Pre-anesthesia Checklist: Patient identified, Emergency Drugs available, Suction available, Patient being monitored and Timeout performed Patient Re-evaluated:Patient Re-evaluated prior to induction Oxygen Delivery Method: Circle system utilized Preoxygenation: Pre-oxygenation with 100% oxygen Induction Type: IV induction Ventilation: Mask ventilation without difficulty and Oral airway inserted - appropriate to patient size Laryngoscope Size: McGraph and 4 Grade View: Grade I Tube type: Oral Tube size: 7.5 mm Number of attempts: 1 Airway Equipment and Method: Stylet and Video-laryngoscopy Placement Confirmation: ETT inserted through vocal cords under direct vision,  positive ETCO2 and breath sounds checked- equal and bilateral Secured at: 22 cm Tube secured with: Tape Dental Injury: Teeth and Oropharynx as per pre-operative assessment

## 2020-01-26 NOTE — Anesthesia Postprocedure Evaluation (Signed)
Anesthesia Post Note  Patient: Jeremy Perry  Procedure(s) Performed: XI ROBOTIC ASSISTED INGUINAL HERNIA REPAIR WITH MESH (Right Groin)  Patient location during evaluation: PACU Anesthesia Type: General Level of consciousness: awake and alert Pain management: pain level controlled Vital Signs Assessment: post-procedure vital signs reviewed and stable Respiratory status: spontaneous breathing, nonlabored ventilation, respiratory function stable and patient connected to nasal cannula oxygen Cardiovascular status: blood pressure returned to baseline and stable Postop Assessment: no apparent nausea or vomiting Anesthetic complications: no     Last Vitals:  Vitals:   01/25/20 1536 01/25/20 1659  BP: 128/79 (!) 155/71  Pulse: 74 61  Resp: 16 18  Temp: (!) 36.4 C   SpO2: 99% 100%    Last Pain:  Vitals:   01/25/20 1659  TempSrc:   PainSc: 3                  Martha Clan

## 2020-04-24 DIAGNOSIS — Z0000 Encounter for general adult medical examination without abnormal findings: Secondary | ICD-10-CM | POA: Diagnosis not present

## 2020-04-24 DIAGNOSIS — Z131 Encounter for screening for diabetes mellitus: Secondary | ICD-10-CM | POA: Diagnosis not present

## 2020-04-24 DIAGNOSIS — Z1331 Encounter for screening for depression: Secondary | ICD-10-CM | POA: Diagnosis not present

## 2020-04-24 DIAGNOSIS — Z13220 Encounter for screening for lipoid disorders: Secondary | ICD-10-CM | POA: Diagnosis not present

## 2020-04-24 DIAGNOSIS — Z23 Encounter for immunization: Secondary | ICD-10-CM | POA: Diagnosis not present

## 2020-09-10 ENCOUNTER — Other Ambulatory Visit: Payer: 59

## 2020-09-10 DIAGNOSIS — Z20822 Contact with and (suspected) exposure to covid-19: Secondary | ICD-10-CM

## 2020-09-11 ENCOUNTER — Encounter: Payer: Self-pay | Admitting: Oncology

## 2020-09-11 ENCOUNTER — Other Ambulatory Visit: Payer: Self-pay | Admitting: Oncology

## 2020-09-11 DIAGNOSIS — U071 COVID-19: Secondary | ICD-10-CM

## 2020-09-11 LAB — SARS-COV-2, NAA 2 DAY TAT

## 2020-09-11 LAB — NOVEL CORONAVIRUS, NAA: SARS-CoV-2, NAA: DETECTED — AB

## 2020-09-11 NOTE — Progress Notes (Signed)
I connected by phone with  Jeremy Perry to discuss the potential use of an new treatment for mild to moderate COVID-19 viral infection in non-hospitalized patients.   This patient is a age/sex that meets the FDA criteria for Emergency Use Authorization of casirivimab\imdevimab.  Has a (+) direct SARS-CoV-2 viral test result 1. Has mild or moderate COVID-19  2. Is ? 33 years of age and weighs ? 40 kg 3. Is NOT hospitalized due to COVID-19 4. Is NOT requiring oxygen therapy or requiring an increase in baseline oxygen flow rate due to COVID-19 5. Is within 10 days of symptom onset 6. Has at least one of the high risk factor(s) for progression to severe COVID-19 and/or hospitalization as defined in EUA. ? Specific high risk criteria :No past medical history on file. ? Obesity    Symptom onset  09/08/2020   I have spoken and communicated the following to the patient or parent/caregiver:   1. FDA has authorized the emergency use of casirivimab\imdevimab for the treatment of mild to moderate COVID-19 in adults and pediatric patients with positive results of direct SARS-CoV-2 viral testing who are 103 years of age and older weighing at least 40 kg, and who are at high risk for progressing to severe COVID-19 and/or hospitalization.   2. The significant known and potential risks and benefits of casirivimab\imdevimab, and the extent to which such potential risks and benefits are unknown.   3. Information on available alternative treatments and the risks and benefits of those alternatives, including clinical trials.   4. Patients treated with casirivimab\imdevimab should continue to self-isolate and use infection control measures (e.g., wear mask, isolate, social distance, avoid sharing personal items, clean and disinfect "high touch" surfaces, and frequent handwashing) according to CDC guidelines.    5. The patient or parent/caregiver has the option to accept or refuse casirivimab\imdevimab .   After  reviewing this information with the patient, The patient agreed to proceed with receiving casirivimab\imdevimab infusion and will be provided a copy of the Fact sheet prior to receiving the infusion.Rulon Abide, AGNP-C [phone] ([place])

## 2020-09-12 ENCOUNTER — Ambulatory Visit (HOSPITAL_COMMUNITY)
Admission: RE | Admit: 2020-09-12 | Discharge: 2020-09-12 | Disposition: A | Discharge status: Home / Self Care | Payer: 59 | Source: Ambulatory Visit | Attending: Pulmonary Disease | Admitting: Pulmonary Disease

## 2020-09-12 DIAGNOSIS — U071 COVID-19: Secondary | ICD-10-CM | POA: Diagnosis not present

## 2020-09-12 MED ORDER — DIPHENHYDRAMINE HCL 50 MG/ML IJ SOLN: 50.0000 mg | Freq: Once | INTRAMUSCULAR | Status: DC | PRN

## 2020-09-12 MED ORDER — SODIUM CHLORIDE 0.9 % IV SOLN: INTRAVENOUS | Status: DC | PRN

## 2020-09-12 MED ORDER — FAMOTIDINE IN NACL 20-0.9 MG/50ML-% IV SOLN: 20.0000 mg | Freq: Once | INTRAVENOUS | Status: DC | PRN

## 2020-09-12 MED ORDER — METHYLPREDNISOLONE SODIUM SUCC 125 MG IJ SOLR: 125.0000 mg | Freq: Once | INTRAMUSCULAR | Status: DC | PRN

## 2020-09-12 MED ORDER — ALBUTEROL SULFATE HFA 108 (90 BASE) MCG/ACT IN AERS: 2.0000 | INHALATION_SPRAY | Freq: Once | RESPIRATORY_TRACT | Status: DC | PRN

## 2020-09-12 MED ORDER — EPINEPHRINE 0.3 MG/0.3ML IJ SOAJ: 0.3000 mg | Freq: Once | INTRAMUSCULAR | Status: DC | PRN

## 2020-09-12 MED ORDER — SODIUM CHLORIDE 0.9 % IV SOLN
1200.0000 mg | Freq: Once | INTRAVENOUS | Status: AC
  Administered 2020-09-12: 1200 mg via INTRAVENOUS

## 2020-09-12 NOTE — Discharge Instructions (Signed)

## 2020-09-12 NOTE — Progress Notes (Signed)
  Diagnosis: COVID-19  Physician: Asencion Noble, MD  Procedure: [facility] Med: casirivimab\imdevimab infusion - Provided patient with casirivimab\imdevimab fact sheet for patients, parents and caregivers prior to infusion.  Complications: No immediate complications noted.  Discharge: Discharged home   Jeremy Perry 09/12/2020

## 2020-11-05 DIAGNOSIS — S3981XD Other specified injuries of abdomen, subsequent encounter: Secondary | ICD-10-CM | POA: Diagnosis not present

## 2020-11-05 DIAGNOSIS — K4090 Unilateral inguinal hernia, without obstruction or gangrene, not specified as recurrent: Secondary | ICD-10-CM | POA: Diagnosis not present

## 2020-11-05 DIAGNOSIS — R1031 Right lower quadrant pain: Secondary | ICD-10-CM | POA: Diagnosis not present

## 2020-11-05 DIAGNOSIS — Z9889 Other specified postprocedural states: Secondary | ICD-10-CM | POA: Diagnosis not present

## 2020-12-25 ENCOUNTER — Other Ambulatory Visit: Payer: Self-pay

## 2020-12-25 ENCOUNTER — Telehealth: Payer: Self-pay

## 2020-12-25 ENCOUNTER — Ambulatory Visit: Payer: Self-pay

## 2020-12-25 DIAGNOSIS — Z0000 Encounter for general adult medical examination without abnormal findings: Secondary | ICD-10-CM

## 2020-12-25 LAB — POCT URINALYSIS DIPSTICK
Bilirubin, UA: NEGATIVE
Blood, UA: NEGATIVE
Glucose, UA: NEGATIVE
Ketones, UA: NEGATIVE
Leukocytes, UA: NEGATIVE
Nitrite, UA: NEGATIVE
Protein, UA: NEGATIVE
Spec Grav, UA: 1.025 (ref 1.010–1.025)
Urobilinogen, UA: 0.2 E.U./dL
pH, UA: 6 (ref 5.0–8.0)

## 2020-12-25 NOTE — Progress Notes (Signed)
Scheduled to complete physical 12/31/20 at 10:15 am.  Testosterone Free & Total added at patient's request.  States he has some things to discuss with Dr at his physical.  AMD

## 2020-12-25 NOTE — Telephone Encounter (Signed)
Called to remind Jeremy Perry of his 2:00 pm appt scheduled for today.  AMD

## 2020-12-26 LAB — CMP12+LP+TP+TSH+6AC+CBC/D/PLT
ALT: 24 IU/L (ref 0–44)
AST: 16 IU/L (ref 0–40)
Albumin/Globulin Ratio: 2.1 (ref 1.2–2.2)
Albumin: 4.8 g/dL (ref 4.0–5.0)
Alkaline Phosphatase: 70 IU/L (ref 44–121)
BUN/Creatinine Ratio: 12 (ref 9–20)
BUN: 13 mg/dL (ref 6–20)
Basophils Absolute: 0 10*3/uL (ref 0.0–0.2)
Basos: 0 %
Bilirubin Total: 0.6 mg/dL (ref 0.0–1.2)
Calcium: 9.8 mg/dL (ref 8.7–10.2)
Chloride: 102 mmol/L (ref 96–106)
Chol/HDL Ratio: 4.3 ratio (ref 0.0–5.0)
Cholesterol, Total: 164 mg/dL (ref 100–199)
Creatinine, Ser: 1.07 mg/dL (ref 0.76–1.27)
EOS (ABSOLUTE): 0.1 10*3/uL (ref 0.0–0.4)
Eos: 1 %
Estimated CHD Risk: 0.8 times avg. (ref 0.0–1.0)
Free Thyroxine Index: 1.8 (ref 1.2–4.9)
GFR calc Af Amer: 105 mL/min/{1.73_m2} (ref >59)
GFR calc non Af Amer: 91 mL/min/{1.73_m2} (ref >59)
GGT: 26 IU/L (ref 0–65)
Globulin, Total: 2.3 g/dL (ref 1.5–4.5)
Glucose: 88 mg/dL (ref 65–99)
HDL: 38 mg/dL — ABNORMAL LOW (ref >39)
Hematocrit: 44.2 % (ref 37.5–51.0)
Hemoglobin: 15.6 g/dL (ref 13.0–17.7)
Immature Grans (Abs): 0 10*3/uL (ref 0.0–0.1)
Immature Granulocytes: 0 %
Iron: 121 ug/dL (ref 38–169)
LDH: 154 IU/L (ref 121–224)
LDL Chol Calc (NIH): 106 mg/dL — ABNORMAL HIGH (ref 0–99)
Lymphocytes Absolute: 1.4 10*3/uL (ref 0.7–3.1)
Lymphs: 28 %
MCH: 32.6 pg (ref 26.6–33.0)
MCHC: 35.3 g/dL (ref 31.5–35.7)
MCV: 93 fL (ref 79–97)
Monocytes Absolute: 0.6 10*3/uL (ref 0.1–0.9)
Monocytes: 12 %
Neutrophils Absolute: 2.9 10*3/uL (ref 1.4–7.0)
Neutrophils: 59 %
Phosphorus: 3.2 mg/dL (ref 2.8–4.1)
Platelets: 196 10*3/uL (ref 150–450)
Potassium: 4 mmol/L (ref 3.5–5.2)
RBC: 4.78 x10E6/uL (ref 4.14–5.80)
RDW: 11.5 % — ABNORMAL LOW (ref 11.6–15.4)
Sodium: 139 mmol/L (ref 134–144)
T3 Uptake Ratio: 25 % (ref 24–39)
T4, Total: 7 ug/dL (ref 4.5–12.0)
TSH: 2.65 u[IU]/mL (ref 0.450–4.500)
Total Protein: 7.1 g/dL (ref 6.0–8.5)
Triglycerides: 107 mg/dL (ref 0–149)
Uric Acid: 6.5 mg/dL (ref 3.8–8.4)
VLDL Cholesterol Cal: 20 mg/dL (ref 5–40)
WBC: 5 10*3/uL (ref 3.4–10.8)

## 2020-12-26 LAB — TESTOSTERONE,FREE AND TOTAL
Testosterone, Free: 24 pg/mL (ref 8.7–25.1)
Testosterone: 594 ng/dL (ref 264–916)

## 2020-12-31 ENCOUNTER — Encounter: Payer: Self-pay | Admitting: Adult Medicine

## 2020-12-31 ENCOUNTER — Other Ambulatory Visit: Payer: Self-pay

## 2020-12-31 ENCOUNTER — Ambulatory Visit: Payer: Self-pay | Admitting: Adult Medicine

## 2020-12-31 VITALS — BP 137/83 | HR 78 | Temp 98.5°F | Resp 12 | Ht 73.0 in | Wt 210.0 lb

## 2020-12-31 DIAGNOSIS — Z0000 Encounter for general adult medical examination without abnormal findings: Secondary | ICD-10-CM

## 2020-12-31 NOTE — Progress Notes (Signed)
I have reviewed the triage vital signs and the nursing notes.   HISTORY  Chief Complaint Annual Exam CoB  HPI Jeremy Perry is a 35 y.o. male    annual physical exam describes longstanding rt inquinal pain rec by [facility] pmd to surgeon then placed rt. inquinal mesh  History reviewed. No pertinent past medical history.  There are no problems to display for this patient.   Past Surgical History:  Procedure Laterality Date  . COLONOSCOPY    . XI ROBOTIC ASSISTED INGUINAL HERNIA REPAIR WITH MESH Right 01/25/2020   Procedure: XI ROBOTIC ASSISTED INGUINAL HERNIA REPAIR WITH MESH;  Surgeon: Herbert Pun, MD;  Location: [facility] ORS;  Service: General;  Laterality: Right;    Prior to Admission medications   Medication Sig Start Date End Date Taking? Authorizing Provider  Lactobacillus (PROBIOTIC ACIDOPHILUS PO) Take 1 capsule by mouth daily.   Yes [provider]  magnesium 30 MG tablet Take 30 mg by mouth daily.   Yes [provider]  Multiple Vitamin (MULTIVITAMIN ADULT PO) Take 1 tablet by mouth daily.   Yes [provider]  ibuprofen (ADVIL) 200 MG tablet Take 400-600 mg by mouth every 6 (six) hours as needed for moderate pain.    [provider]    Allergies Ceclor [cefaclor]  Family History  Problem Relation Age of Onset  . Stroke Paternal Grandmother   . Diabetes type II Paternal Grandmother   . Bipolar disorder Paternal Grandmother     Social History Social History   Tobacco Use  . Smoking status: Never Smoker  . Smokeless tobacco: Never Used  Vaping Use  . Vaping Use: Never used  Substance Use Topics  . Alcohol use: No  . Drug use: No    Review of Systems Constitutional: No fever/chills Eyes: No visual changes. ENT: No sore throat. Cardiovascular: Denies chest pain. Respiratory: Denies shortness of breath. Gastrointestinal: No abdominal pain.  No nausea, no vomiting.  No diarrhea.  No  constipation. Genitourinary: Negative for dysuria. Musculoskeletal: reports positional mod. back pain which has improved with PT Skin: Negative for rash. Neurological: Negative for headaches, focal weakness or numbness. {Psychiatric:  Endocrine:  Hematological/Lymphatic:  Allergic/Immunilogical:  ____________________________________________  PHYSICAL EXAM:  VITAL SIGNS: bmi 27.71 Normotensive 137/83  Constitutional: Alert and oriented. Well appearing and in no acute distress. Eyes: Conjunctivae are normal. PERRL. EOMI. Head: Atraumatic.  Nose: No congestion/rhinnorhea. Mouth/Throat: Mucous membranes are moist. Oropharynx non-erythematous. Neck: No stridor.  Hematological/Lymphatic/Immunilogical: No cervical  Inguinal lymphadenopathy. Cardiovascular: Normal rate, regular rhythm. Grossly normal heart sounds.  Good peripheral circulation. Respiratory: Normal respiratory effort.  No retractions. Lungs CTAB. Gastrointestinal: Soft and nontender. No distention. No abdominal bruits. No CVA tenderness. Genitourinary:  Rt medial inguinal lig tenderness with weakness over lower abd wall..  Musculoskeletal: No lower extremity tenderness nor edema.  No joint effusions.  Neurologic:  Normal speech and language. No gross focal neurologic deficits are appreciated. No gait instability. Skin:  Skin is warm, dry and intact. No rash noted. Old scar from rt. inguinal mesh surgery Psychiatric: Mood and affect are normal. Speech and behavior are normal. ___________________________________________   LABS: HDL 38 Ldl 106  EKG: NSR nl axis intervals, morphology without ectopy  Official radiology report(s): No results found. __________________________________________   INITIAL IMPRESSION / ASSESSMENT AND PLAN / ED COURSE Rt inguinal weakness with direct or indirect hernia,  f/u continues with phys ther. pmd surgeon Otherwise well Exam, Lab, Ekg wnl for this 34yr male. Instructed to reduce  dietary fried  foods Maintain exercise with back brace while working on core strengthening

## 2021-04-25 DIAGNOSIS — K5900 Constipation, unspecified: Secondary | ICD-10-CM | POA: Diagnosis not present

## 2021-04-25 DIAGNOSIS — Z0000 Encounter for general adult medical examination without abnormal findings: Secondary | ICD-10-CM | POA: Diagnosis not present

## 2021-04-25 DIAGNOSIS — R1031 Right lower quadrant pain: Secondary | ICD-10-CM | POA: Diagnosis not present

## 2021-04-25 DIAGNOSIS — M549 Dorsalgia, unspecified: Secondary | ICD-10-CM | POA: Diagnosis not present

## 2021-04-29 ENCOUNTER — Encounter: Payer: Self-pay | Admitting: Adult Health

## 2021-04-29 ENCOUNTER — Other Ambulatory Visit: Payer: Self-pay

## 2021-04-29 ENCOUNTER — Ambulatory Visit: Payer: Self-pay | Admitting: Adult Health

## 2021-04-29 VITALS — BP 128/86 | HR 93 | Temp 97.6°F | Resp 14 | Ht 73.0 in | Wt 205.0 lb

## 2021-04-29 DIAGNOSIS — L02214 Cutaneous abscess of groin: Secondary | ICD-10-CM

## 2021-04-29 MED ORDER — DOXYCYCLINE HYCLATE 100 MG PO TABS: 100.0000 mg | ORAL_TABLET | Freq: Two times a day (BID) | ORAL | 0 refills | Status: DC

## 2021-04-29 NOTE — Progress Notes (Signed)
Pt has had cyst in between thigh and groin area for about 3 days and it ruptured this morning. CL,RMA

## 2021-04-29 NOTE — Progress Notes (Signed)
[facility] [place], [place] [address]   Office Visit Note  Patient Name: Jeremy Perry  [id number]  [id number]  Date of Service: 04/29/2021  Chief Complaint  Patient presents with  . Cyst     HPI Pt is here for a sick visit. He reports history of multiple abscess' in his groin area.  Generally he gets them more in summer.  This one has been present for 3 days.  Last night in his sleep, it ruptured and began draining.     Current Medication:  Outpatient Encounter Medications as of 04/29/2021  Medication Sig Note  . doxycycline (VIBRA-TABS) 100 MG tablet Take 1 tablet (100 mg total) by mouth 2 (two) times daily.   Marland Kitchen ibuprofen (ADVIL) 200 MG tablet Take 400-600 mg by mouth every 6 (six) hours as needed for moderate pain. 12/31/2020: Takes prn  . Lactobacillus (PROBIOTIC ACIDOPHILUS PO) Take 1 capsule by mouth daily.   . magnesium 30 MG tablet Take 30 mg by mouth daily.   . Multiple Vitamin (MULTIVITAMIN ADULT PO) Take 1 tablet by mouth daily.    No facility-administered encounter medications on file as of 04/29/2021.      Medical History: History reviewed. No pertinent past medical history.   Vital Signs: BP 128/86   Pulse 93   Temp 97.6 F (36.4 C)   Resp 14   Ht 6\' 1"  (1.854 m)   Wt 205 lb (93 kg)   SpO2 98%   BMI 27.05 kg/m    Review of Systems  Constitutional: Negative for chills and fever.  Skin:       Abscess in left groin/upper thigh    Physical Exam Skin:    Comments: Patient has area of redness around central lesion.  Open and draining clear/cloudly fluid. Hard nodular area under lesion with palpation.      Assessment/Plan: 1. Abscess of groin, left Take Doxycyline as prescribed. Take complete course of antibiotics as prescribed.  Take with food.  If symptoms fail to improve, or new/worse symptoms develop follow up in clinic.  Also discussed continued warm compress to assist with drainage and to try wearing white cotton  underwear to see if dyed fabrics are causing boils/abscess with sweating.  - doxycycline (VIBRA-TABS) 100 MG tablet; Take 1 tablet (100 mg total) by mouth 2 (two) times daily.  Dispense: 20 tablet; Refill: 0      General Counseling: Cheikh verbalizes understanding of the findings of todays visit and agrees with plan of treatment. I have discussed any further diagnostic evaluation that may be needed or ordered today. We also reviewed his medications today. he has been encouraged to call the office with any questions or concerns that should arise related to todays visit.   No orders of the defined types were placed in this encounter.   Meds ordered this encounter  Medications  . doxycycline (VIBRA-TABS) 100 MG tablet    Sig: Take 1 tablet (100 mg total) by mouth 2 (two) times daily.    Dispense:  20 tablet    Refill:  0    WITH FOOD    Time spent: 20 Minutes    Kendell Bane AGNP-C Nurse Practitioner

## 2021-09-05 ENCOUNTER — Encounter: Payer: Self-pay | Admitting: *Deleted

## 2021-09-09 ENCOUNTER — Telehealth: Payer: Self-pay

## 2021-09-09 NOTE — Telephone Encounter (Signed)
Pt. Calling to schedule a colonoscopy.He says he is off today so he should be able to answer the phone. Anytime after 2pm tomorrow

## 2021-10-24 DIAGNOSIS — M546 Pain in thoracic spine: Secondary | ICD-10-CM | POA: Diagnosis not present

## 2021-10-29 DIAGNOSIS — M4124 Other idiopathic scoliosis, thoracic region: Secondary | ICD-10-CM | POA: Diagnosis not present

## 2021-10-29 DIAGNOSIS — G8929 Other chronic pain: Secondary | ICD-10-CM | POA: Diagnosis not present

## 2021-10-29 DIAGNOSIS — M546 Pain in thoracic spine: Secondary | ICD-10-CM | POA: Diagnosis not present

## 2021-10-29 DIAGNOSIS — M62830 Muscle spasm of back: Secondary | ICD-10-CM | POA: Diagnosis not present

## 2021-10-30 ENCOUNTER — Other Ambulatory Visit: Payer: Self-pay | Admitting: Sports Medicine

## 2021-10-30 DIAGNOSIS — G8929 Other chronic pain: Secondary | ICD-10-CM

## 2021-10-30 DIAGNOSIS — M4124 Other idiopathic scoliosis, thoracic region: Secondary | ICD-10-CM

## 2021-11-05 ENCOUNTER — Encounter: Payer: Self-pay | Admitting: Gastroenterology

## 2021-11-05 ENCOUNTER — Ambulatory Visit (INDEPENDENT_AMBULATORY_CARE_PROVIDER_SITE_OTHER): Payer: 59 | Admitting: Gastroenterology

## 2021-11-05 VITALS — BP 120/77 | HR 73 | Ht 73.0 in | Wt 216.4 lb

## 2021-11-05 DIAGNOSIS — G8929 Other chronic pain: Secondary | ICD-10-CM | POA: Diagnosis not present

## 2021-11-05 DIAGNOSIS — R1031 Right lower quadrant pain: Secondary | ICD-10-CM | POA: Diagnosis not present

## 2021-11-05 NOTE — Progress Notes (Signed)
Gastroenterology Consultation  Referring Provider:     Maryland Pink, MD Primary Care Physician:  Jeremy Pink, MD Primary Gastroenterologist:  Dr. Allen Perry     Reason for Consultation:     Constipation        HPI:   Jeremy Perry is a 35 y.o. y/o male referred for consultation & management of constipation by Dr. Maryland Pink, MD. This patient comes in today with a history of constipation.  The patient contacted his primary care provider last month and was concerned about taking laxatives while waiting for this appointment.  The patient had seen me back in 2015. After the constipation he had loose stools then went to normal then back to constipation.  He reports that he has been having a healthier diet recently and states that he is married to a nutritionist.  The patient reports that he has been having bloating with stools that smelled like sulfur.  He is not having any unexplained weight loss black stools bloody stools fevers or chills.  The patient had a bout of what was determined to be colitis on imaging but a colonoscopy back in 2015 did not show any signs of colitis.  The patient does have some abdominal pain in the right lower quadrant where he had his hernia surgery and states that he had a mesh placed at that point.  He also reports that when he strains to have a bowel movement his hernia mesh area starts to be painful.  History reviewed. No pertinent past medical history.  Past Surgical History:  Procedure Laterality Date   COLONOSCOPY     XI ROBOTIC ASSISTED INGUINAL HERNIA REPAIR WITH MESH Right 01/25/2020   Procedure: XI ROBOTIC ASSISTED INGUINAL HERNIA REPAIR WITH MESH;  Surgeon: Jeremy Pun, MD;  Location: [facility] ORS;  Service: General;  Laterality: Right;    Prior to Admission medications   Medication Sig Start Date End Date Taking? Authorizing Provider  doxycycline (VIBRA-TABS) 100 MG tablet Take 1 tablet (100 mg total) by mouth 2 (two) times daily. 04/29/21    Jeremy Bane, NP  ibuprofen (ADVIL) 200 MG tablet Take 400-600 mg by mouth every 6 (six) hours as needed for moderate pain.    [provider]  Lactobacillus (PROBIOTIC ACIDOPHILUS PO) Take 1 capsule by mouth daily.    [provider]  magnesium 30 MG tablet Take 30 mg by mouth daily.    [provider]  Multiple Vitamin (MULTIVITAMIN ADULT PO) Take 1 tablet by mouth daily.    [provider]    Family History  Problem Relation Age of Onset   Stroke Paternal Grandmother    Diabetes type II Paternal Grandmother    Bipolar disorder Paternal Grandmother      Social History   Tobacco Use   Smoking status: Never   Smokeless tobacco: Never  Vaping Use   Vaping Use: Never used  Substance Use Topics   Alcohol use: No   Drug use: No    Allergies as of 11/05/2021 - Review Complete 11/05/2021  Allergen Reaction Noted   Ceclor [cefaclor] Rash 12/16/2015    Review of Systems:    All systems reviewed and negative except where noted in HPI.   Physical Exam:  BP 120/77 (BP Location: Left Arm, Patient Position: Sitting, Cuff Size: Large)   Pulse 73   Ht 6\' 1"  (1.854 m)   Wt 216 lb 6.4 oz (98.2 kg)   BMI 28.55 kg/m  No LMP for male patient.  General:   Alert,  Well-developed, well-nourished, pleasant and cooperative in NAD Head:  Normocephalic and atraumatic. Eyes:  Sclera clear, no icterus.   Conjunctiva Perry. Ears:  Normal auditory acuity. Neck:  Supple; no masses or thyromegaly. Lungs:  Respirations even and unlabored.  Clear throughout to auscultation.   No wheezes, crackles, or rhonchi. No acute distress. Heart:  Regular rate and rhythm; no murmurs, clicks, rubs, or gallops. Abdomen:  Normal bowel sounds.  No bruits.  Soft, non-tender and non-distended without masses, hepatosplenomegaly or hernias noted.  No guarding or rebound tenderness.  Negative Carnett sign.   Rectal:  Deferred.  Pulses:  Normal pulses noted. Extremities:  No  clubbing or edema.  No cyanosis. Neurologic:  Alert and oriented x3;  grossly normal neurologically. Skin:  Intact without significant lesions or rashes.  No jaundice. Lymph Nodes:  No significant cervical adenopathy. Psych:  Alert and cooperative. Normal mood and affect.  Imaging Studies: No results found.  Assessment and Plan:   Jeremy Perry is a 35 y.o. y/o male who comes in today with a history of right lower quadrant pain and constipation.  The patient reports no worry symptoms such as unexplained weight loss fevers chills nausea vomiting black stools or bloody stools.  He does have pain where his hernia operation was especially when he straining to have stools.  The patient stated that he has started a probiotic and has felt better on the probiotic.  He has been told to continue the probiotic and he will add fiber to his diet.  If his symptoms do not improve then he will contact me and will be set up for colonoscopy due to his change in bowel habits.  The patient has been explained the plan and agrees with it.    Jeremy Lame, MD. Marval Regal    Note: This dictation was prepared with Dragon dictation along with smaller phrase technology. Any transcriptional errors that result from this process are unintentional.

## 2021-11-15 ENCOUNTER — Ambulatory Visit
Admission: RE | Admit: 2021-11-15 | Discharge: 2021-11-15 | Disposition: A | Discharge status: Home / Self Care | Payer: 59 | Source: Ambulatory Visit | Attending: Sports Medicine | Admitting: Sports Medicine

## 2021-11-15 ENCOUNTER — Other Ambulatory Visit: Payer: Self-pay

## 2021-11-15 DIAGNOSIS — M4124 Other idiopathic scoliosis, thoracic region: Secondary | ICD-10-CM | POA: Diagnosis not present

## 2021-11-15 DIAGNOSIS — G8929 Other chronic pain: Secondary | ICD-10-CM | POA: Insufficient documentation

## 2021-11-15 DIAGNOSIS — M546 Pain in thoracic spine: Secondary | ICD-10-CM | POA: Diagnosis not present

## 2021-11-15 DIAGNOSIS — M5124 Other intervertebral disc displacement, thoracic region: Secondary | ICD-10-CM | POA: Diagnosis not present

## 2021-12-11 DIAGNOSIS — G8929 Other chronic pain: Secondary | ICD-10-CM | POA: Diagnosis not present

## 2021-12-11 DIAGNOSIS — M5114 Intervertebral disc disorders with radiculopathy, thoracic region: Secondary | ICD-10-CM | POA: Diagnosis not present

## 2021-12-11 DIAGNOSIS — M546 Pain in thoracic spine: Secondary | ICD-10-CM | POA: Diagnosis not present

## 2022-02-04 DIAGNOSIS — M546 Pain in thoracic spine: Secondary | ICD-10-CM | POA: Diagnosis not present

## 2022-02-04 DIAGNOSIS — Z0000 Encounter for general adult medical examination without abnormal findings: Secondary | ICD-10-CM | POA: Diagnosis not present

## 2022-02-04 DIAGNOSIS — K5900 Constipation, unspecified: Secondary | ICD-10-CM | POA: Diagnosis not present

## 2022-02-04 DIAGNOSIS — M79671 Pain in right foot: Secondary | ICD-10-CM | POA: Diagnosis not present

## 2022-04-11 DIAGNOSIS — M5114 Intervertebral disc disorders with radiculopathy, thoracic region: Secondary | ICD-10-CM | POA: Diagnosis not present

## 2022-04-25 DIAGNOSIS — R7989 Other specified abnormal findings of blood chemistry: Secondary | ICD-10-CM | POA: Diagnosis not present

## 2022-04-29 DIAGNOSIS — G8929 Other chronic pain: Secondary | ICD-10-CM | POA: Diagnosis not present

## 2022-04-29 DIAGNOSIS — M546 Pain in thoracic spine: Secondary | ICD-10-CM | POA: Diagnosis not present

## 2022-04-29 DIAGNOSIS — M5114 Intervertebral disc disorders with radiculopathy, thoracic region: Secondary | ICD-10-CM | POA: Diagnosis not present

## 2022-11-03 DIAGNOSIS — R079 Chest pain, unspecified: Secondary | ICD-10-CM | POA: Diagnosis not present

## 2022-11-03 DIAGNOSIS — R030 Elevated blood-pressure reading, without diagnosis of hypertension: Secondary | ICD-10-CM | POA: Diagnosis not present

## 2022-11-11 DIAGNOSIS — I10 Essential (primary) hypertension: Secondary | ICD-10-CM | POA: Diagnosis not present

## 2022-11-21 ENCOUNTER — Ambulatory Visit: Admit: 2022-11-21 | Discharge status: Against Medical Advice | Payer: 59

## 2022-11-21 DIAGNOSIS — B349 Viral infection, unspecified: Secondary | ICD-10-CM | POA: Diagnosis not present

## 2022-11-21 DIAGNOSIS — Z20822 Contact with and (suspected) exposure to covid-19: Secondary | ICD-10-CM | POA: Diagnosis not present

## 2022-11-21 DIAGNOSIS — R509 Fever, unspecified: Secondary | ICD-10-CM | POA: Diagnosis not present

## 2022-12-11 DIAGNOSIS — I10 Essential (primary) hypertension: Secondary | ICD-10-CM | POA: Diagnosis not present

## 2023-11-08 IMAGING — MR MR THORACIC SPINE W/O CM
6 series · 30 of 48 positions shown · non-contrast
Comparison: None.

CLINICAL DATA: . NKI; no surg; c/o RIGHT mid/upper back pain x3
years

EXAM:
MRI THORACIC SPINE WITHOUT CONTRAST
TECHNIQUE: Multiplanar, multisequence MR imaging of the thoracic spine was
performed. No intravenous contrast was administered.

[Series 16: T1 · sagittal · 5.0mm · 1.88mm/px · 2 of 9 slices shown (1 of 2)]
[im 1/9]
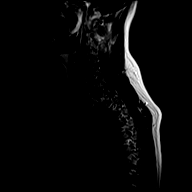
[im 9/9]
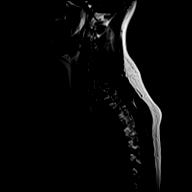

[Series 17: T2 · sagittal · 3.0mm · 1.06mm/px · 6 of 17 slices shown (1 of 2)]
[im 1/17]
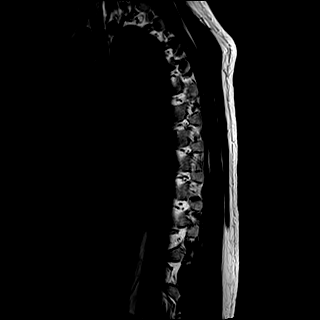
[im 4/17]
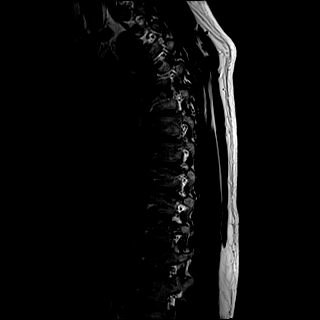
[im 7/17]
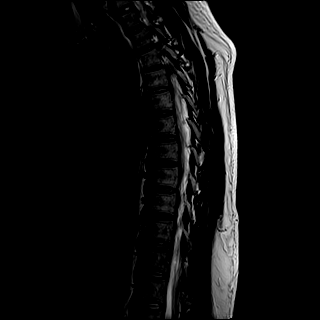
[im 10/17]
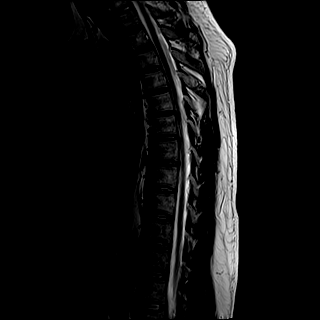
[im 13/17]
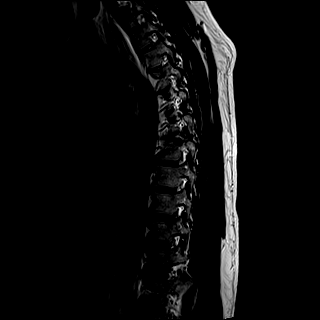
[im 17/17]
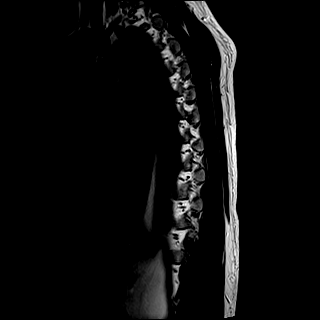

[Series 18: T1 · sagittal · 3.0mm · 1.06mm/px · 6 of 17 slices shown (2 of 2)]
[im 1/17]
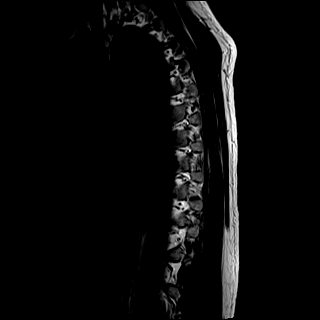
[im 4/17]
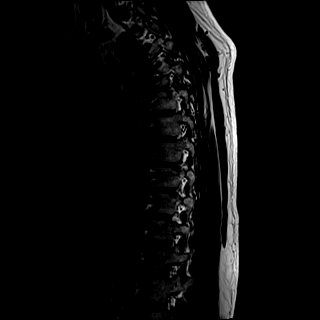
[im 7/17]
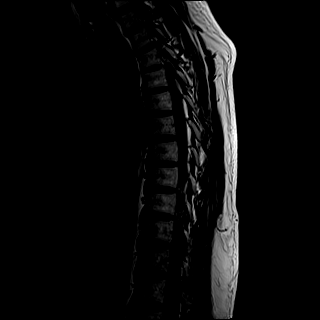
[im 10/17]
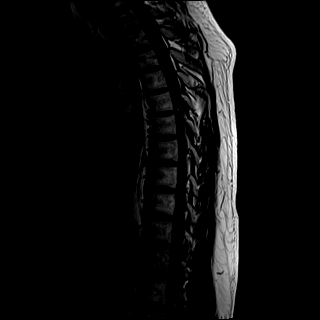
[im 13/17]
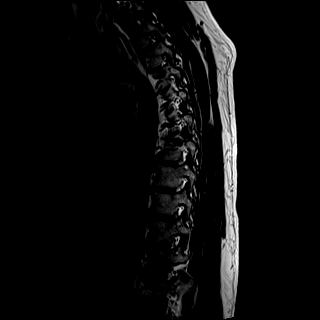
[im 17/17]
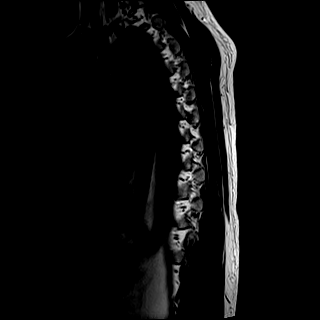

[Series 19: STIR · sagittal · 3.0mm · 0.53mm/px · 6 of 17 slices shown]
[im 1/17]
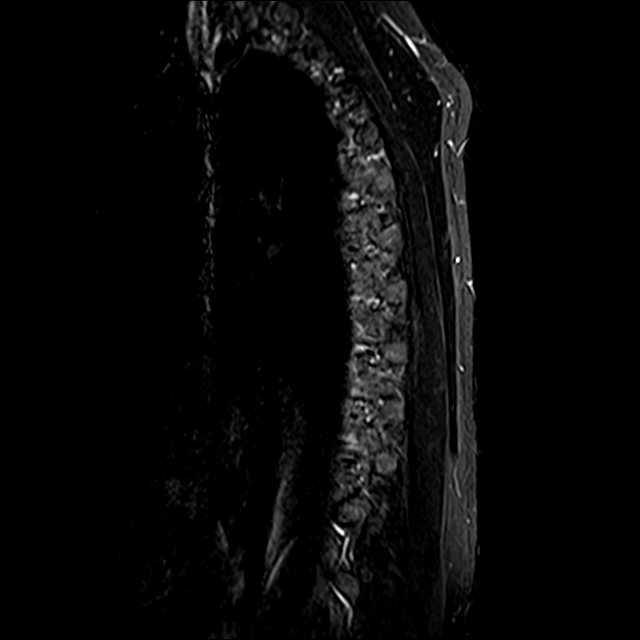
[im 4/17]
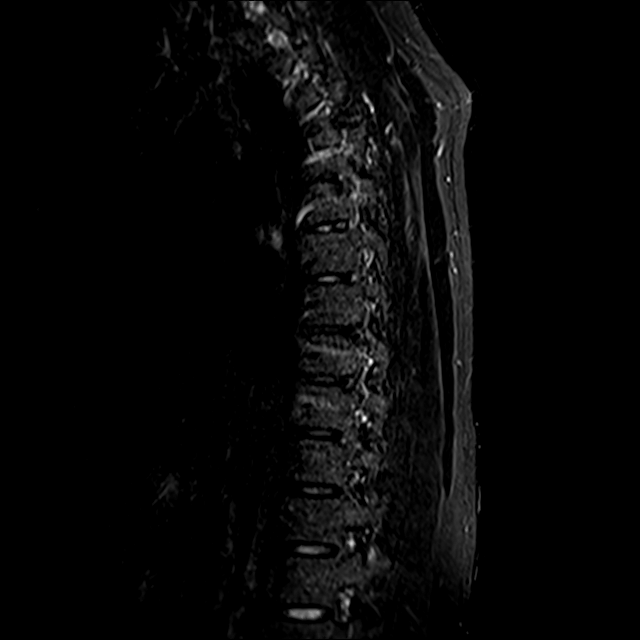
[im 7/17]
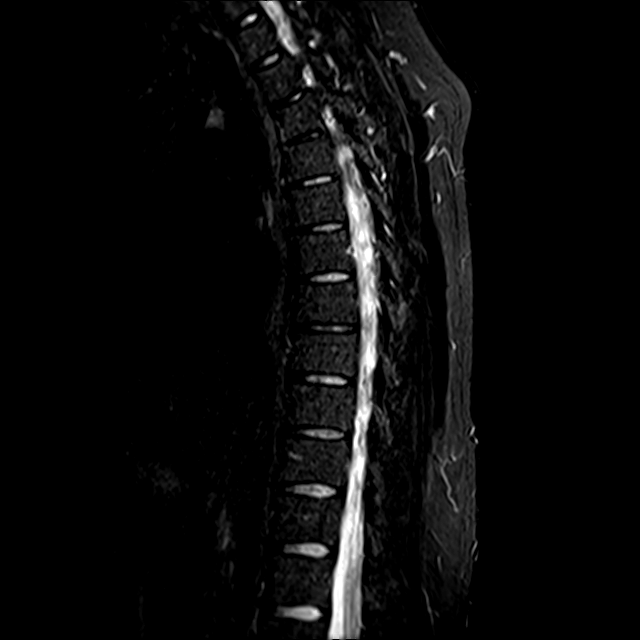
[im 10/17]
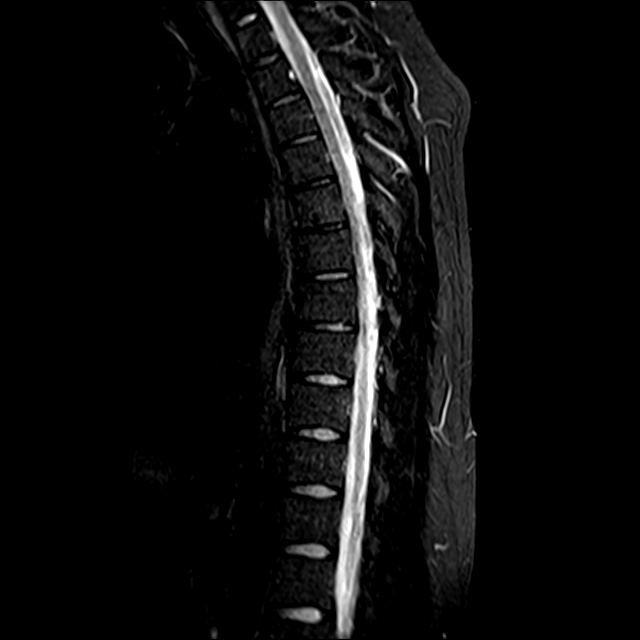
[im 13/17]
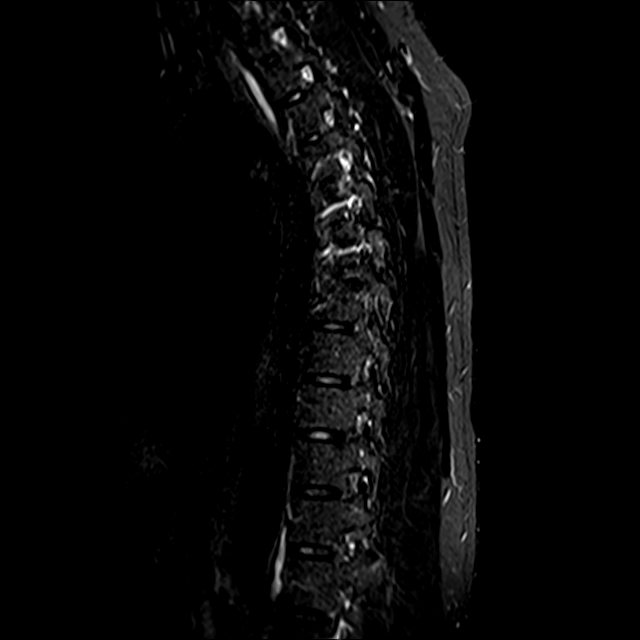
[im 17/17]
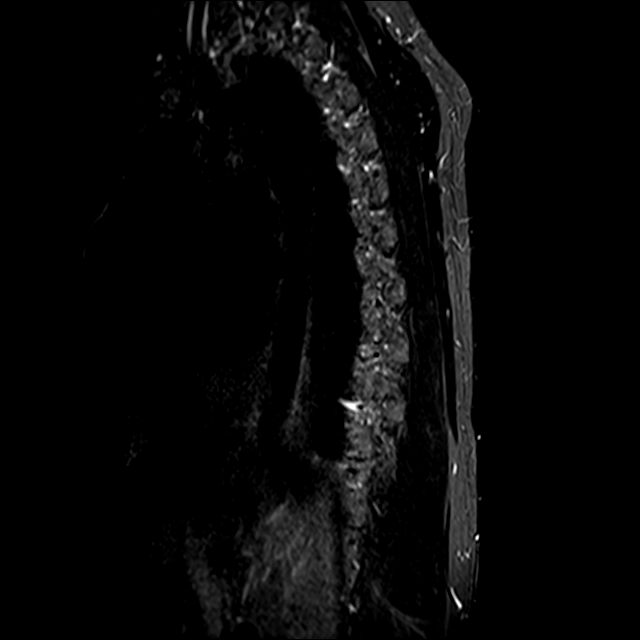

[Series 20: T2 · axial · 4.0mm · 0.59mm/px · z∈[-393,-119]mm · 8 of 39 slices shown (2 of 2)]
[im 1/39]
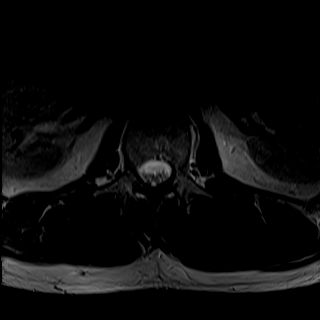
[im 6/39]
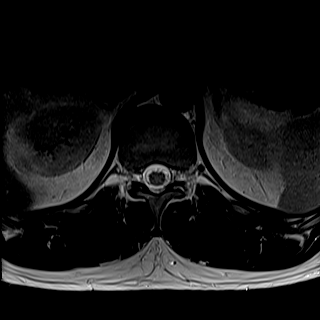
[im 12/39]
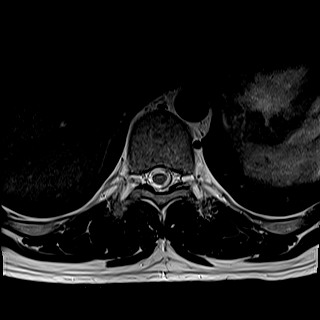
[im 18/39]
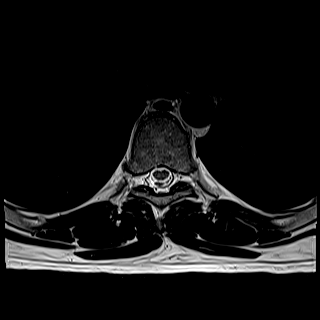
[im 21/39]
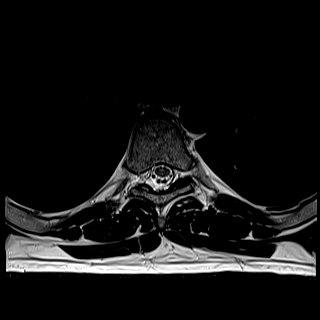
[im 27/39]
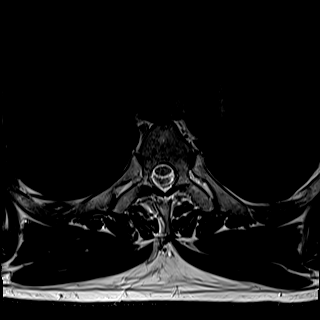
[im 33/39]
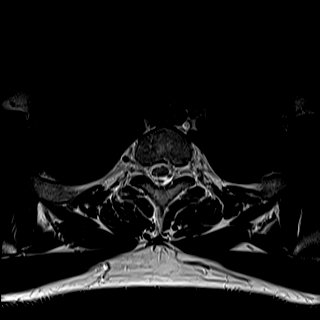
[im 39/39]
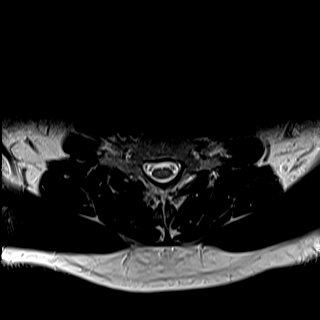

[Series 21: GRE · axial · 4.0mm · 0.37mm/px · z∈[-393,-351]mm · 2 of 39 slices shown]
[im 1/39]
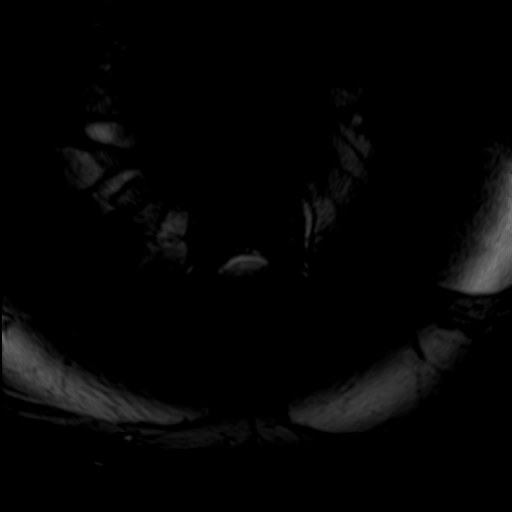
[im 6/39]
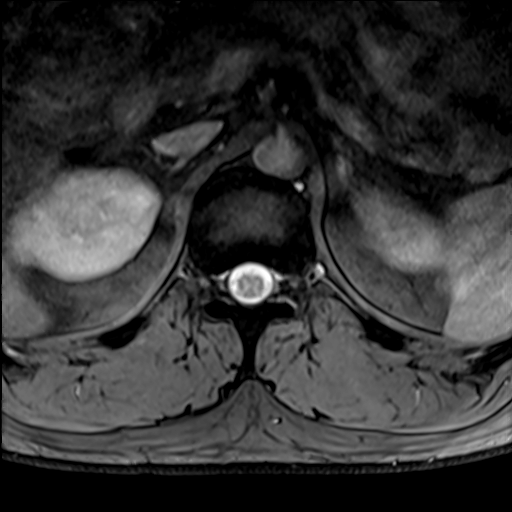

[30 of 48 positions shown; findings below may reference images not displayed]

FINDINGS: Alignment:  Normal

Vertebrae: No fracture, evidence of discitis, or bone lesion.

Cord:  Normal signal and morphology.

Paraspinal and other soft tissues: Negative.

Disc levels:

T7-8: Small central disc protrusion without stenosis

The other disc levels are normal.
IMPRESSION: Small central disc protrusion at T7-8 without stenosis. Otherwise
normal thoracic spine.

## 2025-05-04 ENCOUNTER — Other Ambulatory Visit: Payer: Self-pay
